# Patient Record
Sex: Female | Born: 1955 | Race: White | Hispanic: No | Marital: Married | Smoking: Former smoker
Health system: Southern US, Community
[De-identification: ages and names within clinical notes are randomized; demographics above are authoritative.]

## PROBLEM LIST (undated history)

## (undated) DIAGNOSIS — M8580 Other specified disorders of bone density and structure, unspecified site: Secondary | ICD-10-CM

## (undated) DIAGNOSIS — J449 Chronic obstructive pulmonary disease, unspecified: Secondary | ICD-10-CM

## (undated) DIAGNOSIS — K219 Gastro-esophageal reflux disease without esophagitis: Secondary | ICD-10-CM

## (undated) DIAGNOSIS — E079 Disorder of thyroid, unspecified: Secondary | ICD-10-CM

## (undated) DIAGNOSIS — G8929 Other chronic pain: Secondary | ICD-10-CM

## (undated) DIAGNOSIS — F419 Anxiety disorder, unspecified: Secondary | ICD-10-CM

## (undated) DIAGNOSIS — F32A Depression, unspecified: Secondary | ICD-10-CM

## (undated) DIAGNOSIS — F329 Major depressive disorder, single episode, unspecified: Secondary | ICD-10-CM

## (undated) DIAGNOSIS — E785 Hyperlipidemia, unspecified: Secondary | ICD-10-CM

## (undated) HISTORY — PX: WRIST SURGERY: SHX841

## (undated) HISTORY — DX: Anxiety disorder, unspecified: F41.9

## (undated) HISTORY — DX: Disorder of thyroid, unspecified: E07.9

## (undated) HISTORY — PX: VAGINAL HYSTERECTOMY: SUR661

## (undated) HISTORY — PX: LAMINECTOMY: SHX219

## (undated) HISTORY — PX: SPINAL FUSION: SHX223

## (undated) HISTORY — DX: Other chronic pain: G89.29

## (undated) HISTORY — DX: Major depressive disorder, single episode, unspecified: F32.9

## (undated) HISTORY — DX: Other specified disorders of bone density and structure, unspecified site: M85.80

## (undated) HISTORY — DX: Hyperlipidemia, unspecified: E78.5

## (undated) HISTORY — DX: Depression, unspecified: F32.A

## (undated) HISTORY — DX: Gastro-esophageal reflux disease without esophagitis: K21.9

---

## 2001-08-23 HISTORY — PX: DE QUERVAIN'S RELEASE: SHX1439

## 2005-05-17 ENCOUNTER — Emergency Department (HOSPITAL_COMMUNITY): Admission: EM | Admit: 2005-05-17 | Discharge: 2005-05-17 | Payer: Self-pay | Admitting: *Deleted

## 2005-05-24 ENCOUNTER — Other Ambulatory Visit: Admission: RE | Admit: 2005-05-24 | Discharge: 2005-05-24 | Payer: Self-pay | Admitting: Obstetrics and Gynecology

## 2005-05-31 ENCOUNTER — Encounter: Admission: RE | Admit: 2005-05-31 | Discharge: 2005-05-31 | Payer: Self-pay | Admitting: *Deleted

## 2006-04-18 ENCOUNTER — Other Ambulatory Visit: Admission: RE | Admit: 2006-04-18 | Discharge: 2006-04-18 | Payer: Self-pay | Admitting: Cardiology

## 2006-11-16 ENCOUNTER — Emergency Department (HOSPITAL_COMMUNITY): Admission: EM | Admit: 2006-11-16 | Discharge: 2006-11-16 | Payer: Self-pay | Admitting: Internal Medicine

## 2007-12-31 ENCOUNTER — Encounter
Admission: RE | Admit: 2007-12-31 | Discharge: 2007-12-31 | Payer: Self-pay | Admitting: Physical Medicine and Rehabilitation

## 2008-02-01 ENCOUNTER — Ambulatory Visit (HOSPITAL_COMMUNITY): Admission: RE | Admit: 2008-02-01 | Discharge: 2008-02-01 | Payer: Self-pay | Admitting: Gastroenterology

## 2008-06-04 ENCOUNTER — Encounter: Admission: RE | Admit: 2008-06-04 | Discharge: 2008-06-04 | Payer: Self-pay | Admitting: Obstetrics and Gynecology

## 2008-08-07 ENCOUNTER — Encounter: Admission: RE | Admit: 2008-08-07 | Discharge: 2008-08-07 | Payer: Self-pay | Admitting: Neurosurgery

## 2008-09-17 ENCOUNTER — Inpatient Hospital Stay (HOSPITAL_COMMUNITY): Admission: RE | Admit: 2008-09-17 | Discharge: 2008-09-20 | Payer: Self-pay | Admitting: Neurosurgery

## 2008-12-19 ENCOUNTER — Ambulatory Visit (HOSPITAL_COMMUNITY): Admission: RE | Admit: 2008-12-19 | Discharge: 2008-12-19 | Payer: Self-pay | Admitting: Neurosurgery

## 2009-01-07 ENCOUNTER — Inpatient Hospital Stay (HOSPITAL_COMMUNITY): Admission: AD | Admit: 2009-01-07 | Discharge: 2009-01-08 | Payer: Self-pay | Admitting: Neurosurgery

## 2009-06-07 ENCOUNTER — Emergency Department (HOSPITAL_COMMUNITY): Admission: EM | Admit: 2009-06-07 | Discharge: 2009-06-07 | Payer: Self-pay | Admitting: Emergency Medicine

## 2009-06-24 ENCOUNTER — Encounter: Admission: RE | Admit: 2009-06-24 | Discharge: 2009-06-24 | Payer: Self-pay | Admitting: Obstetrics and Gynecology

## 2009-07-14 ENCOUNTER — Encounter: Admission: RE | Admit: 2009-07-14 | Discharge: 2009-07-14 | Payer: Self-pay | Admitting: Obstetrics and Gynecology

## 2009-10-31 ENCOUNTER — Emergency Department (HOSPITAL_COMMUNITY): Admission: EM | Admit: 2009-10-31 | Discharge: 2009-10-31 | Payer: Self-pay | Admitting: Emergency Medicine

## 2010-09-01 IMAGING — CT CT L SPINE W/O CM
4 of 9 series · 13 of 33 positions shown, 15 images · non-contrast
Comparison: MRI 12/31/2007.

CLINICAL DATA: Severe compression of the left L4 nerve root and
possibly L5.  Preop for PLIF.

CT LUMBAR SPINE WITHOUT CONTRAST
TECHNIQUE: Multidetector CT imaging of the lumbar spine was
performed without intravenous contrast administration. Multiplanar
CT image reconstructions were also generated.

[Series 2: l spine · axial · 0.27mm/px · z∈[-186,-106]mm · 2 of 96 slices shown]
[im 32/96  bone]
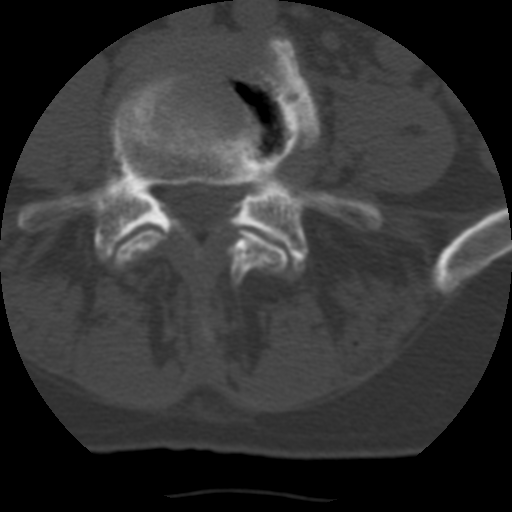
[im 64/96  bone]
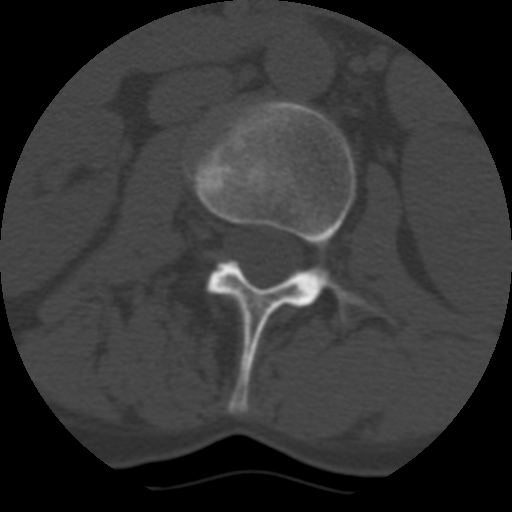

[Series 3: bone windows · axial · 0.27mm/px · z∈[-206,-86]mm · 3 of 96 slices shown, 4 images]
[im 24/96  soft-tissue]
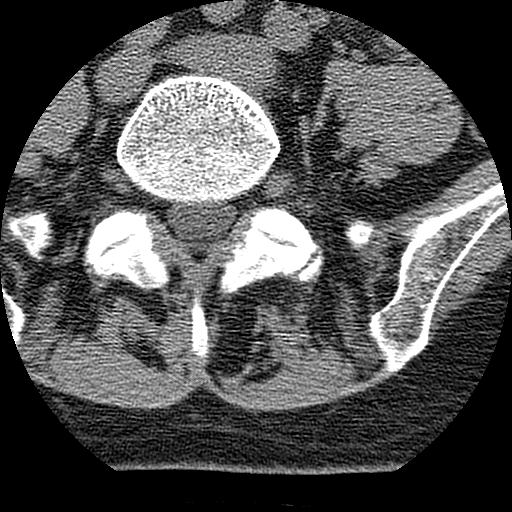
[im 24/96  bone]
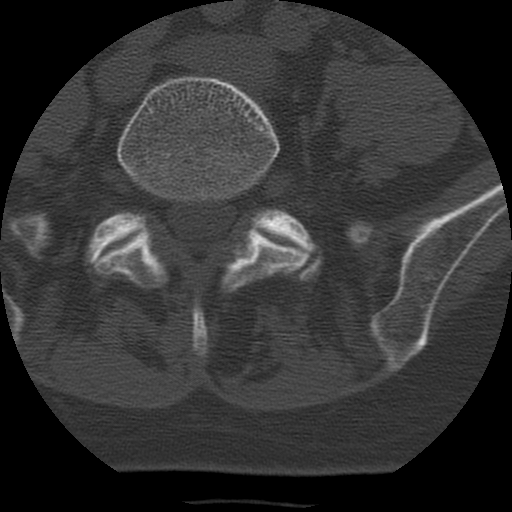
[im 48/96  bone]
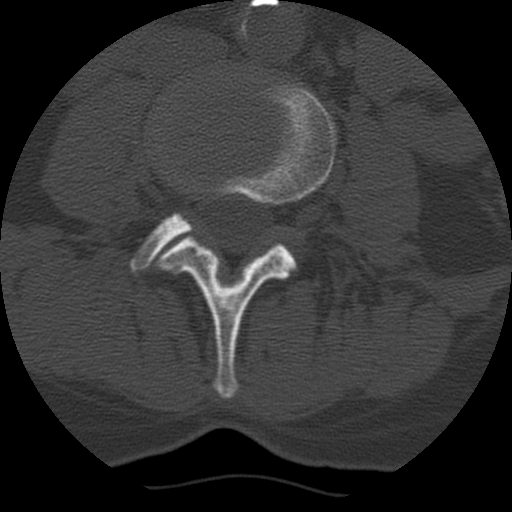
[im 72/96  bone]
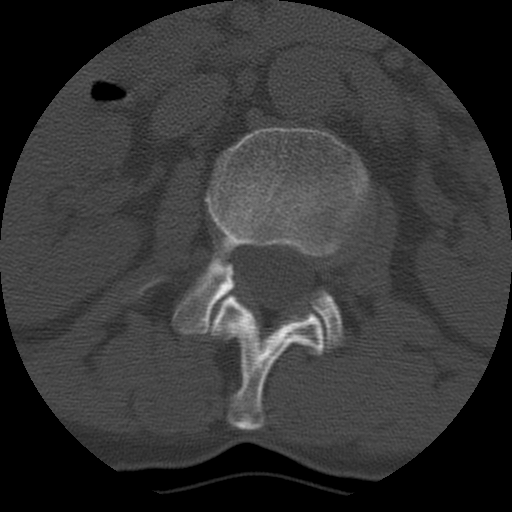

[Series 400: cor · coronal · 0.47mm/px · 3 of 40 slices shown]
[im 8/40  bone]
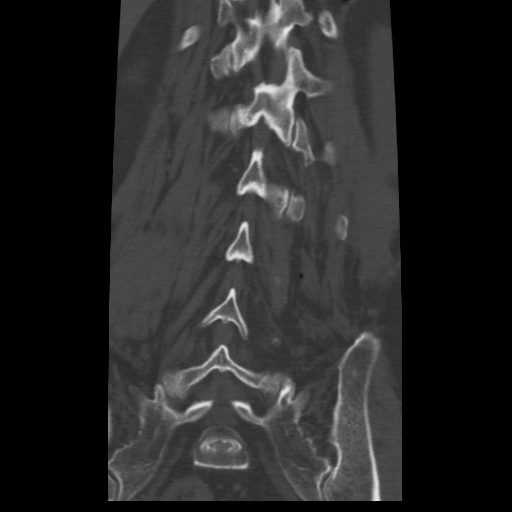
[im 16/40  bone]
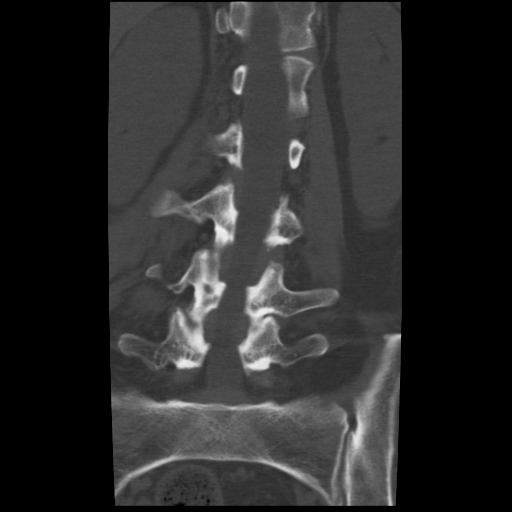
[im 24/40  bone]
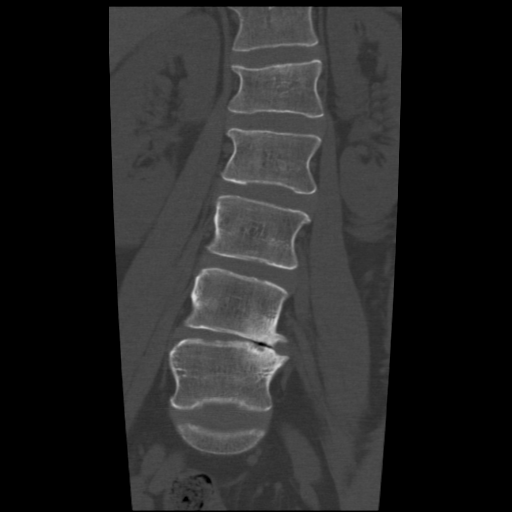

[Series 401: sag · sagittal · 0.47mm/px · 5 of 40 slices shown, 6 images]
[im 14/40  bone]
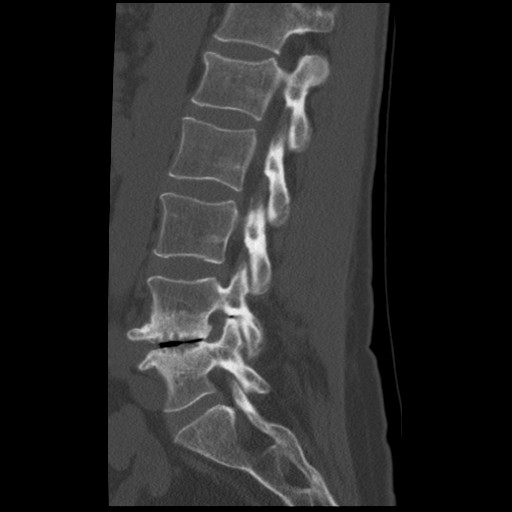
[im 17/40  bone]
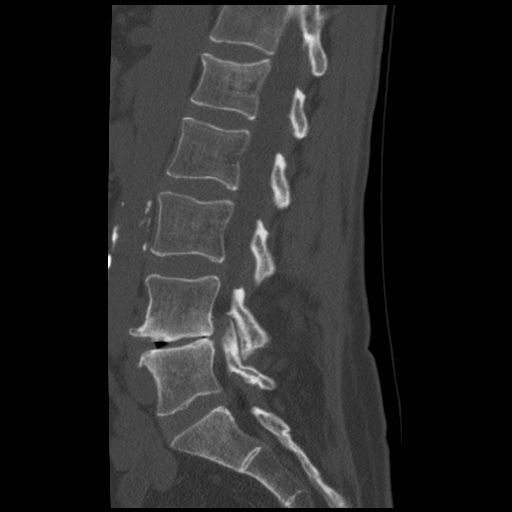
[im 20/40  soft-tissue]
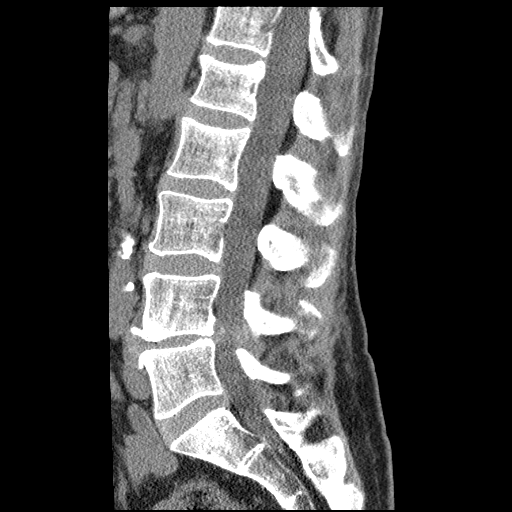
[im 20/40  bone]
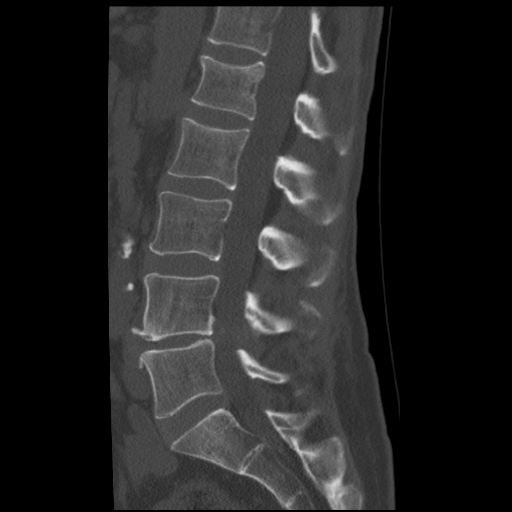
[im 23/40  bone]
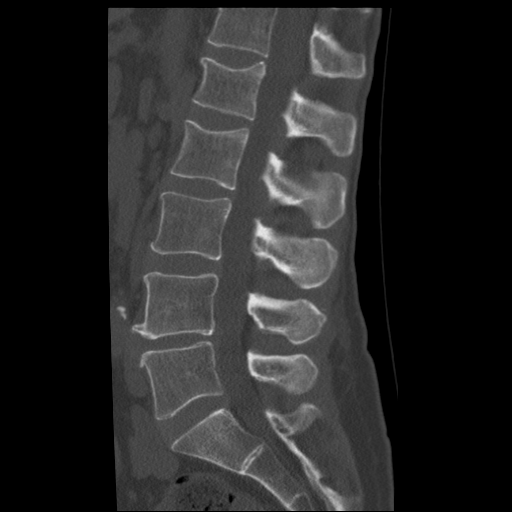
[im 27/40  bone]
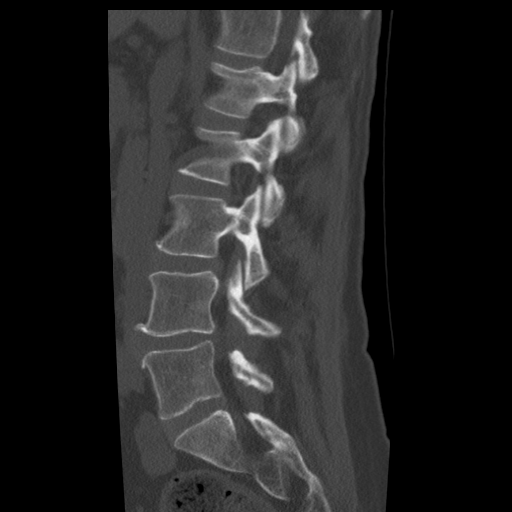

[13 of 33 positions shown; findings below may reference images not displayed]

FINDINGS: There is S-shaped curvature of the lumbar spine convexed
to the left at L1-2 into the right at L4-5.  There is left sided
endplate sclerosis enlarged left lateral osteophytes at L4-5.
Endplate erosions are noted.  Chronic superior endplate to
depression at L1 is stable.  The soft tissues are unremarkable.
Individual disc levels are as follows.  The disc levels at L1-2
rule and above are normal.

L2-3:  There is mild facet degenerative change.  There is no focal
stenosis.

L3-4:  Mild facet degenerative changes evident.  There is some disc
bulging.  There is no significant stenosis.

L4-5:  Severe left foraminal stenosis is secondary to asymmetric
disc bulge and marked to facet hypertrophy.  There is endplate
osteophyte formation as well.  The mild right foraminal narrowing
is stable.

L5-S1:  There is mild asymmetric facet hypertrophy, left greater
than right.  No focal stenosis is evident.
IMPRESSION: 1.  Severe left to lateral recess and foraminal narrowing at L4-5
secondary to asymmetric disc bulging and facet disease.
2.  Focal dextroconvex curvature at L4-5.
3.  Mild right foraminal narrowing at L4-5.
4.  Mild facet degenerative changes at L2-3, L3-4, and L5 S1
without to additional focal stenoses.

## 2010-09-02 ENCOUNTER — Ambulatory Visit
Admission: RE | Admit: 2010-09-02 | Discharge: 2010-09-02 | Payer: Self-pay | Source: Home / Self Care | Attending: Obstetrics & Gynecology | Admitting: Obstetrics & Gynecology

## 2010-09-22 ENCOUNTER — Ambulatory Visit (HOSPITAL_COMMUNITY)
Admission: RE | Admit: 2010-09-22 | Discharge: 2010-09-22 | Payer: Self-pay | Source: Home / Self Care | Attending: Obstetrics and Gynecology | Admitting: Obstetrics and Gynecology

## 2010-09-23 ENCOUNTER — Ambulatory Visit: Admit: 2010-09-23 | Payer: Self-pay | Admitting: Obstetrics and Gynecology

## 2010-09-23 ENCOUNTER — Ambulatory Visit: Payer: PRIVATE HEALTH INSURANCE | Admitting: Obstetrics and Gynecology

## 2010-09-23 DIAGNOSIS — M899 Disorder of bone, unspecified: Secondary | ICD-10-CM

## 2010-09-23 DIAGNOSIS — M949 Disorder of cartilage, unspecified: Secondary | ICD-10-CM

## 2010-10-28 ENCOUNTER — Ambulatory Visit: Payer: PRIVATE HEALTH INSURANCE | Admitting: Obstetrics and Gynecology

## 2010-11-13 NOTE — Progress Notes (Unsigned)
NAMETAYRA, DAWE NO.:  [id number]  MEDICAL RECORD NO.:  [id number]           PATIENT TYPE:  A  LOCATION:  [facility]                   FACILITY:  [facility]  PHYSICIAN:  Argentina Donovan, MD        DATE OF BIRTH:  1956-08-19  DATE OF SERVICE:  09/23/2010                                 CLINIC NOTE  The patient is a 54 year old Caucasian female who had total abdominal hysterectomy, bilateral salpingo-oophorectomy in her 30s, was placed on estrogen and subsequently her mother developed breast cancer.  She got frightened and stopped the estrogen that she had been on it for 10 years.  She takes calcium with vitamin D plus Fosamax 2 years ago. Three years ago, she had a bone density which showed osteopenia.  We did repeat that and there had been no change over that time.  She also had a normal mammogram done and is doing well with her current therapy, so we will keep her on that.  IMPRESSION:  The patient has osteopenia, on treatment with good success.          ______________________________ Argentina Donovan, MD    PR/MEDQ  D:  09/23/2010  T:  09/24/2010  Job:  [id number]

## 2010-11-16 LAB — CULTURE, BLOOD (ROUTINE X 2)
Culture: NO GROWTH
Culture: NO GROWTH

## 2010-11-16 LAB — CBC
HCT: 38.2 % (ref 36.0–46.0)
Hemoglobin: 13.1 g/dL (ref 12.0–15.0)
MCHC: 34.3 g/dL (ref 30.0–36.0)
MCV: 97.2 fL (ref 78.0–100.0)
Platelets: 128 10*3/uL — ABNORMAL LOW (ref 150–400)
RBC: 3.93 MIL/uL (ref 3.87–5.11)
RDW: 14.9 % (ref 11.5–15.5)
WBC: 4.2 10*3/uL (ref 4.0–10.5)

## 2010-11-16 LAB — DIFFERENTIAL
Basophils Absolute: 0 10*3/uL (ref 0.0–0.1)
Basophils Relative: 0 % (ref 0–1)
Eosinophils Absolute: 0 10*3/uL (ref 0.0–0.7)
Eosinophils Relative: 1 % (ref 0–5)
Lymphocytes Relative: 54 % — ABNORMAL HIGH (ref 12–46)
Lymphs Abs: 2.2 10*3/uL (ref 0.7–4.0)
Monocytes Absolute: 0.3 10*3/uL (ref 0.1–1.0)
Monocytes Relative: 6 % (ref 3–12)
Neutro Abs: 1.6 10*3/uL — ABNORMAL LOW (ref 1.7–7.7)
Neutrophils Relative %: 39 % — ABNORMAL LOW (ref 43–77)

## 2010-11-16 LAB — URINALYSIS, ROUTINE W REFLEX MICROSCOPIC
Bilirubin Urine: NEGATIVE
Glucose, UA: NEGATIVE mg/dL
Hgb urine dipstick: NEGATIVE
Ketones, ur: NEGATIVE mg/dL
Nitrite: NEGATIVE
Protein, ur: NEGATIVE mg/dL
Specific Gravity, Urine: 1.015 (ref 1.005–1.030)
Urobilinogen, UA: 0.2 mg/dL (ref 0.0–1.0)
pH: 5.5 (ref 5.0–8.0)

## 2010-11-16 LAB — BASIC METABOLIC PANEL
BUN: 10 mg/dL (ref 6–23)
CO2: 30 mEq/L (ref 19–32)
Calcium: 7.9 mg/dL — ABNORMAL LOW (ref 8.4–10.5)
Chloride: 107 mEq/L (ref 96–112)
Creatinine, Ser: 0.51 mg/dL (ref 0.4–1.2)
GFR calc Af Amer: >60 mL/min (ref >60)
GFR calc non Af Amer: >60 mL/min (ref >60)
Glucose, Bld: 84 mg/dL (ref 70–99)
Potassium: 4.1 mEq/L (ref 3.5–5.1)
Sodium: 140 mEq/L (ref 135–145)

## 2010-11-16 LAB — LACTIC ACID, PLASMA: Lactic Acid, Venous: 0.4 mmol/L — ABNORMAL LOW (ref 0.5–2.2)

## 2010-11-26 LAB — URINALYSIS, ROUTINE W REFLEX MICROSCOPIC
Glucose, UA: NEGATIVE mg/dL
Protein, ur: NEGATIVE mg/dL
Specific Gravity, Urine: 1.025 (ref 1.005–1.030)

## 2010-11-26 LAB — COMPREHENSIVE METABOLIC PANEL
ALT: 9 U/L (ref 0–35)
BUN: 8 mg/dL (ref 6–23)
CO2: 31 mEq/L (ref 19–32)
Calcium: 8 mg/dL — ABNORMAL LOW (ref 8.4–10.5)
Creatinine, Ser: 0.49 mg/dL (ref 0.4–1.2)
GFR calc non Af Amer: >60 mL/min (ref >60)
Glucose, Bld: 80 mg/dL (ref 70–99)
Total Protein: 5.3 g/dL — ABNORMAL LOW (ref 6.0–8.3)

## 2010-11-26 LAB — RAPID URINE DRUG SCREEN, HOSP PERFORMED
Barbiturates: NOT DETECTED
Benzodiazepines: NOT DETECTED
Opiates: POSITIVE — AB

## 2010-11-26 LAB — DIFFERENTIAL
Eosinophils Absolute: 0 10*3/uL (ref 0.0–0.7)
Lymphocytes Relative: 52 % — ABNORMAL HIGH (ref 12–46)
Lymphs Abs: 1.9 10*3/uL (ref 0.7–4.0)
Monocytes Relative: 6 % (ref 3–12)
Neutro Abs: 1.5 10*3/uL — ABNORMAL LOW (ref 1.7–7.7)
Neutrophils Relative %: 41 % — ABNORMAL LOW (ref 43–77)

## 2010-11-26 LAB — CBC
Hemoglobin: 11.5 g/dL — ABNORMAL LOW (ref 12.0–15.0)
MCHC: 33.3 g/dL (ref 30.0–36.0)
MCV: 97.4 fL (ref 78.0–100.0)
RBC: 3.54 MIL/uL — ABNORMAL LOW (ref 3.87–5.11)
RDW: 14.6 % (ref 11.5–15.5)

## 2010-12-01 LAB — COMPREHENSIVE METABOLIC PANEL
CO2: 31 mEq/L (ref 19–32)
Calcium: 9.3 mg/dL (ref 8.4–10.5)
Creatinine, Ser: 0.55 mg/dL (ref 0.4–1.2)
GFR calc Af Amer: >60 mL/min (ref >60)
GFR calc non Af Amer: >60 mL/min (ref >60)
Glucose, Bld: 101 mg/dL — ABNORMAL HIGH (ref 70–99)

## 2010-12-01 LAB — DIFFERENTIAL
Lymphocytes Relative: 32 % (ref 12–46)
Lymphs Abs: 2.2 10*3/uL (ref 0.7–4.0)
Neutrophils Relative %: 63 % (ref 43–77)

## 2010-12-01 LAB — CBC
Hemoglobin: 14.9 g/dL (ref 12.0–15.0)
MCHC: 34.6 g/dL (ref 30.0–36.0)
MCV: 97.4 fL (ref 78.0–100.0)
RBC: 4.42 MIL/uL (ref 3.87–5.11)
RDW: 12 % (ref 11.5–15.5)

## 2010-12-01 LAB — APTT: aPTT: 27 seconds (ref 24–37)

## 2010-12-01 LAB — URINALYSIS, ROUTINE W REFLEX MICROSCOPIC
Protein, ur: NEGATIVE mg/dL
Urobilinogen, UA: 0.2 mg/dL (ref 0.0–1.0)

## 2010-12-01 LAB — PROTIME-INR
INR: 1 (ref 0.00–1.49)
Prothrombin Time: 13.4 seconds (ref 11.6–15.2)

## 2010-12-02 LAB — CBC
MCV: 99.8 fL (ref 78.0–100.0)
Platelets: 159 10*3/uL (ref 150–400)
WBC: 4.4 10*3/uL (ref 4.0–10.5)

## 2010-12-02 LAB — URINE MICROSCOPIC-ADD ON

## 2010-12-02 LAB — URINALYSIS, ROUTINE W REFLEX MICROSCOPIC
Nitrite: NEGATIVE
Specific Gravity, Urine: 1.021 (ref 1.005–1.030)
pH: 7 (ref 5.0–8.0)

## 2010-12-02 LAB — DIFFERENTIAL
Basophils Relative: 0 % (ref 0–1)
Lymphocytes Relative: 33 % (ref 12–46)
Monocytes Absolute: 0.2 10*3/uL (ref 0.1–1.0)
Monocytes Relative: 5 % (ref 3–12)
Neutro Abs: 2.7 10*3/uL (ref 1.7–7.7)

## 2010-12-02 LAB — APTT: aPTT: 28 seconds (ref 24–37)

## 2010-12-02 LAB — COMPREHENSIVE METABOLIC PANEL
AST: 34 U/L (ref 0–37)
Albumin: 3.7 g/dL (ref 3.5–5.2)
Chloride: 101 mEq/L (ref 96–112)
Creatinine, Ser: 0.62 mg/dL (ref 0.4–1.2)
GFR calc Af Amer: >60 mL/min (ref >60)
Total Bilirubin: 0.5 mg/dL (ref 0.3–1.2)

## 2010-12-07 LAB — URINALYSIS, ROUTINE W REFLEX MICROSCOPIC
Bilirubin Urine: NEGATIVE
Nitrite: NEGATIVE
Specific Gravity, Urine: 1.023 (ref 1.005–1.030)
Urobilinogen, UA: 0.2 mg/dL (ref 0.0–1.0)
pH: 7 (ref 5.0–8.0)

## 2010-12-07 LAB — COMPREHENSIVE METABOLIC PANEL
ALT: 21 U/L (ref 0–35)
Albumin: 3.6 g/dL (ref 3.5–5.2)
Alkaline Phosphatase: 29 U/L — ABNORMAL LOW (ref 39–117)
Calcium: 9.2 mg/dL (ref 8.4–10.5)
Potassium: 4.1 mEq/L (ref 3.5–5.1)
Sodium: 137 mEq/L (ref 135–145)
Total Protein: 5.8 g/dL — ABNORMAL LOW (ref 6.0–8.3)

## 2010-12-07 LAB — APTT: aPTT: 24 seconds (ref 24–37)

## 2010-12-07 LAB — DIFFERENTIAL
Basophils Relative: 0 % (ref 0–1)
Lymphs Abs: 2.1 10*3/uL (ref 0.7–4.0)
Monocytes Absolute: 0.3 10*3/uL (ref 0.1–1.0)
Monocytes Relative: 5 % (ref 3–12)
Neutro Abs: 2.6 10*3/uL (ref 1.7–7.7)

## 2010-12-07 LAB — TYPE AND SCREEN: Antibody Screen: NEGATIVE

## 2010-12-07 LAB — CBC
Platelets: 153 10*3/uL (ref 150–400)
RDW: 13.4 % (ref 11.5–15.5)

## 2010-12-07 LAB — ABO/RH: ABO/RH(D): B POS

## 2011-01-05 NOTE — Op Note (Signed)
NAMEROBYNNE, ROAT                 ACCOUNT NO.:  [id number]   MEDICAL RECORD NO.:  [id number]          PATIENT TYPE:  AMB   LOCATION:  ENDO                         FACILITY:  [facility]   PHYSICIAN:  Shirley Friar, MDDATE OF BIRTH:  12-03-1955   DATE OF PROCEDURE:  02/01/2008  DATE OF DISCHARGE:                               OPERATIVE REPORT   INDICATIONS:  Surveillance for colon polyps.   MEDICINES:  Propofol 450 mg IV, fentanyl 100 mcg IV, Versed 4 mg IV  (sedation given per anesthesia).   FINDINGS:  Rectal exam was normal.  A pediatric Pentax colonoscope was  inserted into a fair prepped colon and advanced to the cecum where  ileocecal valve and appendiceal orifice were identified.  On careful  withdrawal of the colonoscope, no polyps were identified.  There were a  few small diverticula seen in the sigmoid colon but otherwise  colonoscopy was normal.  Retroflexion was unremarkable.   ASSESSMENT:  Sigmoid diverticulosis, otherwise normal colonoscopy.   PLAN:  Repeat colonoscopy in five years.      Shirley Friar, MD  Electronically Signed     VCS/MEDQ  D:  02/01/2008  T:  02/01/2008  Job:  [id number]   cc:   Nolon Nations, MD
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
## 2011-01-05 NOTE — Discharge Summary (Signed)
Jessica Holmes, Jessica Holmes                 ACCOUNT NO.:  [id number]   MEDICAL RECORD NO.:  [id number]          PATIENT TYPE:  INP   LOCATION:  3014                         FACILITY:  [facility]   PHYSICIAN:  Payton Doughty, M.D.      DATE OF BIRTH:  04/23/1956   DATE OF ADMISSION:  09/17/2008  DATE OF DISCHARGE:  09/20/2008                               DISCHARGE SUMMARY   ADMITTING DIAGNOSIS:  Spondylosis, L4-5.   DISCHARGE DIAGNOSIS:  Spondylosis, L4-5.   PROCEDURES:  L4-5 anterolateral interbody fusion using AxiaLIF  technique.   SURGEON:  Payton Doughty, M.D.   COMPLICATIONS:  None.   DISCHARGE STATUS:  Alive and well.   BODY OF TEXT:  A 55 year old right-handed white girl whose history and  physical is recounted in chart.  She has spondylosis of L4-5.  She has  been on pain management.  She has had positive scan and is admitted for  fusion.  General exam is intact.  She had a history of hepatitis C.  Neurologic exam is intact for left L3 and L4 radiculopathy.  She was  admitted after ascertaining normal laboratory values, underwent an  AxiaLIF at L4-5.  Postoperatively, she has done pretty well.  Because of  all her meds, she was kept in the ICU for couple of days to make sure  respirations are okay.  She spent one night on the floor.  She is up and  about eating and voiding normally.  Strength is full.  Incision is dry  and well healing.  She is being discharged home to the care of her  family on her Kadian and morphine sulfate.  The plan will be to taper  that as she recovers from her operation.           ______________________________  Payton Doughty, M.D.     MWR/MEDQ  D:  09/20/2008  T:  09/20/2008  Job:  [id number]

## 2011-01-05 NOTE — Op Note (Signed)
NAMEPIHU, BASIL                 ACCOUNT NO.:  [id number]   MEDICAL RECORD NO.:  [id number]          PATIENT TYPE:  INP   LOCATION:  3315                         FACILITY:  [facility]   PHYSICIAN:  Payton Doughty, M.D.      DATE OF BIRTH:  09-03-1955   DATE OF PROCEDURE:  DATE OF DISCHARGE:                               OPERATIVE REPORT   PREOPERATIVE DIAGNOSIS:  Left L4-5 radiculopathy secondary to  spondylosis.   POSTOPERATIVE DIAGNOSIS:  Left L4-5 radiculopathy secondary to  spondylosis.   OPERATIVE PROCEDURE:  Left L4-5 laminotomy, foraminotomy, discectomy.   ANESTHESIA:  General endotracheal.   PREPARATION:  Prepped and draped with alcohol wipe.   COMPLICATIONS:  None.   __________   BODY TEXT:  A 55 year old girl who had an XLIF about 4 months ago, now  has some residual left leg pain, taken to the operating room, smoothly  anesthetized and intubated, placed prone on the operating table.  Following shave, prep, and drape in the usual sterile fashion, the skin  was infiltrated with 1% lidocaine and 1:400,000 epinephrine.  The skin  was incised over the L4 lamina.  This was dissected free in the  subperiosteal plane.  Intraoperative x-ray confirmed correctness of  level.  Having confirmed correctness of level, hemi semi-laminectomy was  carried out of L4 to the top of ligamentum flavum that was removed in a  retrograde fashion dissolving and decompression of the lateral recess.  The 5 root was followed under the top of 5 lamina and generously  decompressed.  The neuroforamen at L4-5 was explored and found to be  open.  The posterior remnant of the disk was removed from dorsal to the  interbody device for the XLIF.  Both the 4 and 5 roots were completely  decompressed.  The wound was irrigated.  Hemostasis assured.  Depo-  Medrol soaked fat was placed in the laminotomy defect.  Successive  layers of 0 Vicryl, 2-0 Vicryl, and 3-0 nylon were used to close.  Betadine and Telfa  dressing was applied, made occlusive with OpSite, and  the patient returned to recovery room in good condition.           ______________________________  Payton Doughty, M.D.     MWR/MEDQ  D:  01/07/2009  T:  01/08/2009  Job:  [id number]
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
## 2011-01-05 NOTE — Op Note (Signed)
Jessica Holmes, Jessica Holmes                 ACCOUNT NO.:  [id number]   MEDICAL RECORD NO.:  [id number]          PATIENT TYPE:  AMB   LOCATION:  ENDO                         FACILITY:  [facility]   PHYSICIAN:  Shirley Friar, MDDATE OF BIRTH:  1956-02-29   DATE OF PROCEDURE:  02/01/2008  DATE OF DISCHARGE:                               OPERATIVE REPORT   PROCEDURE:  Upper GI.   INDICATIONS:  Reflux.   MEDICINES:  See preceding colonoscopy.   FINDINGS:  Endoscope was inserted through oropharynx, esophagus was  intubated and was normal in its entirety.  Endoscope was advanced down  into the stomach which revealed some linear erythematous streaks in the  distal stomach consistent with mild gastritis.  No ulcers were  identified.  Retroflexion was done which revealed normal proximal  stomach.  Endoscope was straightened and advanced into the duodenal bulb  and second portion of the duodenum which were both normal.  Endoscope  was withdrawn and confirmed the above findings.   ASSESSMENT:  Mild antral gastritis, likely due to medicines, otherwise  normal upper endoscopy.   PLAN:  Daily proton pump inhibitor and avoid NSAIDs.      Shirley Friar, MD  Electronically Signed     VCS/MEDQ  D:  02/01/2008  T:  02/01/2008  Job:  [id number]   cc:   Nolon Nations, MD

## 2011-01-05 NOTE — Op Note (Signed)
[name], [name]                 ACCOUNT NO.:  [id number]   MEDICAL RECORD NO.:  [id number]          PATIENT TYPE:  INP   LOCATION:  2899                         FACILITY:  [facility]   PHYSICIAN:  Payton Doughty, M.D.      DATE OF BIRTH:  15-Dec-1955   DATE OF PROCEDURE:  09/17/2008  DATE OF DISCHARGE:                               OPERATIVE REPORT   PREOPERATIVE DIAGNOSIS:  Spondylosis and lateral stenosis at L4-L5.   POSTOPERATIVE DIAGNOSIS:  Spondylosis and lateral stenosis at L4-L5.   PROCEDURE:  L4-L5 extreme lateral interbody fusion with lateral plate.   SURGEON:  Payton Doughty, MD   ANESTHESIA:  General endotracheal.   PREPARATION:  Prepped and draped with alcohol wipe.   COMPLICATIONS:  None.   NURSE ASSISTANT:  Bedelia Person, MD   DOCTOR ASSISTANT:  Cristi Loron, MD   BODY OF TEXT:  A 55 year old lady with severe spondylosis and lateral  left-sided osteophyte at L4-L5 and left leg pain, taken to operating  room, smoothly anesthetized and intubated.  Placed left side up, right  side down lateral position on the operating table.  Flexed and taped.  The table was flexed to allow access past the iliac crest into the L4-L5  disk space.  Monitoring electrodes were placed.  Following shave, prep,  and drape in the usual sterile fashion, 2 skin incisions were made right  at the iliac crest, one directly laterally and one slightly posterior  through the posterior incision.  The fascia was penetrated and the  retroperitoneal space accessed.  The lateral incision was then used to  connect these with assurance that no major organs were violated.  The  stimulating trocar was advanced down through the psoas muscle and under  fluoro, the Steinmann pin was placed in the disk space.  Initial  attempts at dilation were met with monitoring warnings that were close  to the pelvic plexus.  The Steinmann pin was then positioned more  anteriorly and the disk space accessed and the correct  docking  identified, this time without the nerve monitoring alarms.  Sequential  dilation at psoas was carried out under electrical monitoring and then  the dilator was placed.  A shim placed into the disk space.  The psoas  muscle dilated out of the way and the disk space accessed.  Lateral  osteophyte was taken down.  The disk space was then entered and  diskectomy carried out.  Working across the disk space, a lateral  release was obtained.  The disk space trials were placed and it was  found that the 8 x 18 x 50 mm footprint worked best.  This was packed  with bone-inducing compound and tapped into place.  A lateral plate was  then placed using parallel screws along the disk space under  fluoroscopic guidance and  bicortical purchase was obtained.  The plates were attached.  The rods  were placed and locked down.  Final fluoroscopic films showed good  placement of all devices.  The incisions were closed in successive  layers 0 Vicryl, 2-0 Vicryl,  and 3-0 nylon and the patient returned to  the recovery room in good condition.           ______________________________  Payton Doughty, M.D.     MWR/MEDQ  D:  09/17/2008  T:  09/17/2008  Job:  [id number]

## 2011-05-11 ENCOUNTER — Ambulatory Visit (INDEPENDENT_AMBULATORY_CARE_PROVIDER_SITE_OTHER): Payer: PRIVATE HEALTH INSURANCE | Admitting: Orthopedic Surgery

## 2011-05-11 ENCOUNTER — Encounter: Payer: Self-pay | Admitting: Orthopedic Surgery

## 2011-05-11 DIAGNOSIS — M1710 Unilateral primary osteoarthritis, unspecified knee: Secondary | ICD-10-CM

## 2011-05-11 DIAGNOSIS — M222X9 Patellofemoral disorders, unspecified knee: Secondary | ICD-10-CM

## 2011-05-11 DIAGNOSIS — M25569 Pain in unspecified knee: Secondary | ICD-10-CM

## 2011-05-12 ENCOUNTER — Encounter: Payer: Self-pay | Admitting: Orthopedic Surgery

## 2011-05-12 DIAGNOSIS — M222X9 Patellofemoral disorders, unspecified knee: Secondary | ICD-10-CM | POA: Insufficient documentation

## 2011-05-12 DIAGNOSIS — M1710 Unilateral primary osteoarthritis, unspecified knee: Secondary | ICD-10-CM | POA: Insufficient documentation

## 2011-05-12 DIAGNOSIS — M179 Osteoarthritis of knee, unspecified: Secondary | ICD-10-CM | POA: Insufficient documentation

## 2011-05-12 NOTE — Progress Notes (Signed)
Chief complaint: Pain right knee HPI:(4) I have a 41 low female who was referred to me by Dr. Loraine Leriche away with a complaint of anterior knee pain. She describes her pain as nonradiating located on the anterior medial aspect of the right knee associated with stairclimbing swelling. She denies catching locking or giving way. She's had this pain now for several months.  ROS:(2) normal at present  PFSH: (1)  Past Medical History  Diagnosis Date  . Anxiety   . Chronic pain   . Thyroid disorder   . Osteopenia   . Acid reflux      Physical Exam(12) GENERAL: normal development   CDV: pulses are normal   Skin: normal  Lymph: nodes were not palpable/normal  Psychiatric: awake, alert and oriented  Neuro: normal sensation  MSK ambulation is normal.  The right knee has crepitance and tenderness around the peripatellar joint. Range of motion is normal. Strength is normal. Knee joint stability is normal.  Left knee is also noted to have crepitance on range of motion no tenderness no swelling normal strength, range of motion, stability.  Imaging: An office imaging shows that she is patellofemoral arthritis with spurs the tibiofemoral articulation is normal and the alignment is normal  Assessment: Primarily patellofemoral arthritis    Plan: Anti-inflammatories and quadriceps strengthening program. No surgery needed at this time.

## 2011-09-06 ENCOUNTER — Other Ambulatory Visit (HOSPITAL_COMMUNITY): Payer: Self-pay | Admitting: Family Medicine

## 2011-09-06 DIAGNOSIS — Z1231 Encounter for screening mammogram for malignant neoplasm of breast: Secondary | ICD-10-CM

## 2011-10-05 ENCOUNTER — Ambulatory Visit (HOSPITAL_COMMUNITY)
Admission: RE | Admit: 2011-10-05 | Discharge: 2011-10-05 | Disposition: A | Discharge status: Home / Self Care | Payer: PRIVATE HEALTH INSURANCE | Source: Ambulatory Visit | Attending: Family Medicine | Admitting: Family Medicine

## 2011-10-05 DIAGNOSIS — Z1231 Encounter for screening mammogram for malignant neoplasm of breast: Secondary | ICD-10-CM

## 2012-06-11 ENCOUNTER — Observation Stay (HOSPITAL_COMMUNITY)
Admission: EM | Admit: 2012-06-11 | Discharge: 2012-06-12 | Disposition: A | Discharge status: Home / Self Care | Payer: PRIVATE HEALTH INSURANCE | Attending: Internal Medicine | Admitting: Internal Medicine

## 2012-06-11 ENCOUNTER — Emergency Department (HOSPITAL_COMMUNITY): Payer: PRIVATE HEALTH INSURANCE

## 2012-06-11 ENCOUNTER — Encounter (HOSPITAL_COMMUNITY): Payer: Self-pay | Admitting: Emergency Medicine

## 2012-06-11 DIAGNOSIS — R509 Fever, unspecified: Secondary | ICD-10-CM | POA: Diagnosis present

## 2012-06-11 DIAGNOSIS — K219 Gastro-esophageal reflux disease without esophagitis: Secondary | ICD-10-CM | POA: Diagnosis present

## 2012-06-11 DIAGNOSIS — N289 Disorder of kidney and ureter, unspecified: Secondary | ICD-10-CM | POA: Insufficient documentation

## 2012-06-11 DIAGNOSIS — G894 Chronic pain syndrome: Secondary | ICD-10-CM | POA: Insufficient documentation

## 2012-06-11 DIAGNOSIS — N179 Acute kidney failure, unspecified: Secondary | ICD-10-CM

## 2012-06-11 DIAGNOSIS — G929 Unspecified toxic encephalopathy: Principal | ICD-10-CM | POA: Insufficient documentation

## 2012-06-11 DIAGNOSIS — J4489 Other specified chronic obstructive pulmonary disease: Secondary | ICD-10-CM | POA: Insufficient documentation

## 2012-06-11 DIAGNOSIS — G8929 Other chronic pain: Secondary | ICD-10-CM | POA: Diagnosis present

## 2012-06-11 DIAGNOSIS — E039 Hypothyroidism, unspecified: Secondary | ICD-10-CM | POA: Insufficient documentation

## 2012-06-11 DIAGNOSIS — E079 Disorder of thyroid, unspecified: Secondary | ICD-10-CM

## 2012-06-11 DIAGNOSIS — Y92009 Unspecified place in unspecified non-institutional (private) residence as the place of occurrence of the external cause: Secondary | ICD-10-CM | POA: Insufficient documentation

## 2012-06-11 DIAGNOSIS — M8580 Other specified disorders of bone density and structure, unspecified site: Secondary | ICD-10-CM | POA: Insufficient documentation

## 2012-06-11 DIAGNOSIS — Z79899 Other long term (current) drug therapy: Secondary | ICD-10-CM | POA: Insufficient documentation

## 2012-06-11 DIAGNOSIS — E860 Dehydration: Secondary | ICD-10-CM | POA: Insufficient documentation

## 2012-06-11 DIAGNOSIS — G92 Toxic encephalopathy: Secondary | ICD-10-CM | POA: Insufficient documentation

## 2012-06-11 DIAGNOSIS — F411 Generalized anxiety disorder: Secondary | ICD-10-CM | POA: Insufficient documentation

## 2012-06-11 DIAGNOSIS — T4275XA Adverse effect of unspecified antiepileptic and sedative-hypnotic drugs, initial encounter: Secondary | ICD-10-CM | POA: Insufficient documentation

## 2012-06-11 DIAGNOSIS — F419 Anxiety disorder, unspecified: Secondary | ICD-10-CM | POA: Diagnosis present

## 2012-06-11 DIAGNOSIS — M1710 Unilateral primary osteoarthritis, unspecified knee: Secondary | ICD-10-CM | POA: Diagnosis present

## 2012-06-11 DIAGNOSIS — J449 Chronic obstructive pulmonary disease, unspecified: Secondary | ICD-10-CM | POA: Diagnosis present

## 2012-06-11 DIAGNOSIS — R4182 Altered mental status, unspecified: Secondary | ICD-10-CM | POA: Diagnosis present

## 2012-06-11 DIAGNOSIS — M179 Osteoarthritis of knee, unspecified: Secondary | ICD-10-CM | POA: Diagnosis present

## 2012-06-11 DIAGNOSIS — I609 Nontraumatic subarachnoid hemorrhage, unspecified: Secondary | ICD-10-CM | POA: Diagnosis present

## 2012-06-11 HISTORY — DX: Chronic obstructive pulmonary disease, unspecified: J44.9

## 2012-06-11 LAB — CBC WITH DIFFERENTIAL/PLATELET
Eosinophils Absolute: 0 10*3/uL (ref 0.0–0.7)
Hemoglobin: 14.9 g/dL (ref 12.0–15.0)
Lymphs Abs: 1.1 10*3/uL (ref 0.7–4.0)
Monocytes Relative: 3 % (ref 3–12)
Neutro Abs: 8 10*3/uL — ABNORMAL HIGH (ref 1.7–7.7)
Neutrophils Relative %: 85 % — ABNORMAL HIGH (ref 43–77)
Platelets: 150 10*3/uL (ref 150–400)
RBC: 4.79 MIL/uL (ref 3.87–5.11)
WBC: 9.4 10*3/uL (ref 4.0–10.5)

## 2012-06-11 LAB — POCT I-STAT 3, ART BLOOD GAS (G3+)
Acid-Base Excess: 4 mmol/L — ABNORMAL HIGH (ref 0.0–2.0)
Bicarbonate: 32.6 mEq/L — ABNORMAL HIGH (ref 20.0–24.0)
Patient temperature: 37
TCO2: 35 mmol/L (ref 0–100)

## 2012-06-11 LAB — BASIC METABOLIC PANEL
BUN: 26 mg/dL — ABNORMAL HIGH (ref 6–23)
Chloride: 102 mEq/L (ref 96–112)
GFR calc Af Amer: 30 mL/min — ABNORMAL LOW (ref >90)
GFR calc non Af Amer: 26 mL/min — ABNORMAL LOW (ref >90)
Glucose, Bld: 131 mg/dL — ABNORMAL HIGH (ref 70–99)
Potassium: 3.9 mEq/L (ref 3.5–5.1)
Sodium: 141 mEq/L (ref 135–145)

## 2012-06-11 LAB — HEPATIC FUNCTION PANEL
ALT: 31 U/L (ref 0–35)
AST: 22 U/L (ref 0–37)
Albumin: 3.6 g/dL (ref 3.5–5.2)
Bilirubin, Direct: <0.1 mg/dL (ref 0.0–0.3)
Total Bilirubin: 0.4 mg/dL (ref 0.3–1.2)

## 2012-06-11 LAB — RAPID URINE DRUG SCREEN, HOSP PERFORMED
Amphetamines: NOT DETECTED
Barbiturates: NOT DETECTED
Cocaine: NOT DETECTED
Opiates: POSITIVE — AB
Tetrahydrocannabinol: NOT DETECTED

## 2012-06-11 LAB — URINALYSIS, ROUTINE W REFLEX MICROSCOPIC
Glucose, UA: NEGATIVE mg/dL
Hgb urine dipstick: NEGATIVE
Ketones, ur: NEGATIVE mg/dL
Protein, ur: 30 mg/dL — AB
Urobilinogen, UA: 0.2 mg/dL (ref 0.0–1.0)

## 2012-06-11 LAB — URINE MICROSCOPIC-ADD ON

## 2012-06-11 MED ORDER — GUAIFENESIN-DM 100-10 MG/5ML PO SYRP: 5.0000 mL | ORAL_SOLUTION | ORAL | Status: DC | PRN

## 2012-06-11 MED ORDER — HYDROCODONE-ACETAMINOPHEN 5-325 MG PO TABS: 1.0000 | ORAL_TABLET | ORAL | Status: DC | PRN

## 2012-06-11 MED ORDER — NALOXONE HCL 0.4 MG/ML IJ SOLN
0.4000 mg | Freq: Once | INTRAMUSCULAR | Status: AC
  Administered 2012-06-11: 0.4 mg via INTRAVENOUS
  Filled 2012-06-11: qty 1

## 2012-06-11 MED ORDER — ONDANSETRON HCL 4 MG/2ML IJ SOLN
4.0000 mg | Freq: Four times a day (QID) | INTRAMUSCULAR | Status: DC | PRN
Start: 2012-06-11 — End: 2012-06-12

## 2012-06-11 MED ORDER — LEVOTHYROXINE SODIUM 50 MCG PO TABS
50.0000 ug | ORAL_TABLET | Freq: Every day | ORAL | Status: DC
  Administered 2012-06-12: 50 ug via ORAL
  Filled 2012-06-11 (×2): qty 1

## 2012-06-11 MED ORDER — NALOXONE HCL 0.4 MG/ML IJ SOLN
INTRAMUSCULAR | Status: AC
  Filled 2012-06-11: qty 1

## 2012-06-11 MED ORDER — NALOXONE HCL 0.4 MG/ML IJ SOLN
0.4000 mg | INTRAMUSCULAR | Status: DC | PRN
  Administered 2012-06-11: 0.4 mg via INTRAVENOUS

## 2012-06-11 MED ORDER — SODIUM CHLORIDE 0.9 % IJ SOLN: 3.0000 mL | Freq: Two times a day (BID) | INTRAMUSCULAR | Status: DC

## 2012-06-11 MED ORDER — SODIUM CHLORIDE 0.9 % IV SOLN: INTRAVENOUS | Status: DC

## 2012-06-11 MED ORDER — ALBUTEROL SULFATE (5 MG/ML) 0.5% IN NEBU: 2.5000 mg | INHALATION_SOLUTION | RESPIRATORY_TRACT | Status: DC | PRN

## 2012-06-11 MED ORDER — SODIUM CHLORIDE 0.9 % IV SOLN
INTRAVENOUS | Status: DC
  Administered 2012-06-11: 120 mL/h via INTRAVENOUS

## 2012-06-11 MED ORDER — SODIUM CHLORIDE 0.9 % IV BOLUS (SEPSIS)
1000.0000 mL | Freq: Once | INTRAVENOUS | Status: AC
  Administered 2012-06-11: 1000 mL via INTRAVENOUS

## 2012-06-11 MED ORDER — ONDANSETRON HCL 4 MG PO TABS: 4.0000 mg | ORAL_TABLET | Freq: Four times a day (QID) | ORAL | Status: DC | PRN

## 2012-06-11 NOTE — ED Notes (Addendum)
Pt reports confusion x 3 weeks. Pt AAOx4. Family reports pt had chest congestion, low oxygen level, increased confusion for past 3-4 hours. Pt last seen normal last night. Pulse ox 85% in triage. Pt placed on O2 2L pulse ox increased to 91%.

## 2012-06-11 NOTE — ED Notes (Addendum)
Narcan given pt alert. Family at bedside

## 2012-06-11 NOTE — H&P (Addendum)
[facility]                                                                                    Patient Demographics  Jessica Holmes, is a 56 y.o. female  CSN: [id number]  MRN: [id number]  DOB - 1955-09-15  Admit Date - 06/11/2012  Outpatient Primary MD for the patient is [name], [name], MD   With History of -  Past Medical History  Diagnosis Date  . Anxiety   . Chronic pain   . Thyroid disorder   . Osteopenia   . Acid reflux   . COPD (chronic obstructive pulmonary disease)       Past Surgical History  Procedure Date  . Spinal fusion   . Vaginal hysterectomy   . Wrist surgery   . Laminectomy     in for   Chief Complaint  Patient presents with  . Altered Mental Status     HPI  Jessica Holmes  is a 56 y.o. female, with history of GERD, COPD, chronic back pain since 2010, narcotic abuser, who is trying to taper her narcotics down and according to the husband all her narcotics and lock and key and only given to her by her husband on scheduled time as prescribed by her physician, comes to the hospital after husband found her to be extremity lethargic and sleepy, she arrived in the ER where she was found to have acute renal failure, according to the husband patient had been eating and drinking less for the last few days, her chest x-ray was unremarkable lab work was essentially under MAC about except for acute renal insufficiency, she received a dose of Narcan it ER with good results, she immediately woke up now she is only mildly somnolent, she denies any intentional overdose of medications i.e. her narcotics, it was thought that due to her decreased by mouth intake she developed acute renal insufficiency and hence her pain medications a cumulative system causing decreased mental status.  She currently has no subjective complaints, she has good cough reflex, mentating well, only slightly somnolent.    Review of Systems    In addition to the HPI above, No  Fever-chills, No Headache, No changes with Vision or hearing, No problems swallowing food or Liquids, No Chest pain, Cough or Shortness of Breath, No Abdominal pain, No Nausea or Vommitting, Bowel movements are regular, No Blood in stool or Urine, No dysuria, No new skin rashes or bruises, No new joints pains-aches, chronic low back pain No new weakness, tingling, numbness in any extremity, No recent weight gain or loss, No polyuria, polydypsia or polyphagia, No significant Mental Stressors.  A full 10 point Review of Systems was done, except as stated above, all other Review of Systems were negative.   Social History History  Substance Use Topics  . Smoking status: Never Smoker   . Smokeless tobacco: Not on file  . Alcohol Use: No     Family History Family History  Problem Relation Age of Onset  . Arthritis    . Cancer       Prior to Admission medications   Medication Sig Start Date End Date Taking?  Authorizing Provider  alendronate (FOSAMAX) 70 MG tablet Take 70 mg by mouth every 7 (seven) days. Take with a full glass of water on an empty stomach.Takes on Sunday   Yes Historical Provider, MD  BIOTIN PO Take 3,000 mcg by mouth daily.    Yes Historical Provider, MD  Coenzyme Q10 (COQ10 PO) Take 100 mg by mouth daily.    Yes Historical Provider, MD  Eszopiclone (ESZOPICLONE) 3 MG TABS Take 3 mg by mouth at bedtime. Take immediately before bedtime   Yes Historical Provider, MD  L-Lysine 500 MG CAPS Take 1 capsule by mouth 2 (two) times daily.    Yes Historical Provider, MD  levothyroxine (SYNTHROID, LEVOTHROID) 50 MCG tablet Take 50 mcg by mouth daily.   Yes Historical Provider, MD  LORazepam (ATIVAN) 2 MG tablet Take 2 mg by mouth 3 (three) times daily.    Yes Historical Provider, MD  morphine (MS CONTIN) 100 MG 12 hr tablet Take 100 mg by mouth daily.    Yes Historical Provider, MD  morphine (MSIR) 30 MG tablet Take 30 mg by mouth 2 (two) times daily.    Yes Historical  Provider, MD  Multiple Vitamin (MULTIVITAMIN PO) Take 1 tablet by mouth daily.    Yes Historical Provider, MD  omega-3 acid ethyl esters (LOVAZA) 1 G capsule Take 1.2 g by mouth 2 (two) times daily.   Yes Historical Provider, MD  pantoprazole (PROTONIX) 40 MG tablet Take 40 mg by mouth daily.    Yes Historical Provider, MD  Pitavastatin Calcium (LIVALO) 4 MG TABS Take 1 tablet by mouth daily.   Yes Historical Provider, MD  pregabalin (LYRICA) 150 MG capsule Take 150 mg by mouth 2 (two) times daily. For pain   Yes Historical Provider, MD  traZODone (DESYREL) 100 MG tablet Take 200 mg by mouth at bedtime.    Yes Historical Provider, MD    No Known Allergies  Physical Exam  Vitals  Blood pressure 128/59, pulse 98, temperature 98.1 F (36.7 C), temperature source Oral, resp. rate 12, SpO2 98.00%.   1. General middle-aged Caucasian female lying in bed in NAD,  somewhat sleepy but answering all questions at this time.  2. Normal affect and insight, Not Suicidal or Homicidal, Awake Alert, Oriented X 2.  3. No F.N deficits, ALL C.Nerves Intact, Strength 5/5 all 4 extremities, Sensation intact all 4 extremities, Plantars down going.  4. Ears and Eyes appear Normal, Conjunctivae clear, PERRLA. Moist Oral Mucosa.  5. Supple Neck, No JVD, No cervical lymphadenopathy appriciated, No Carotid Bruits.  6. Symmetrical Chest wall movement, Good air movement bilaterally, CTAB.  7. RRR, No Gallops, Rubs or Murmurs, No Parasternal Heave.  8. Positive Bowel Sounds, Abdomen Soft, Non tender, No organomegaly appriciated,No rebound -guarding or rigidity.  9.  No Cyanosis, Normal Skin Turgor, No Skin Rash or Bruise.  10. Good muscle tone,  joints appear normal , no effusions, Normal ROM.  11. No Palpable Lymph Nodes in Neck or Axillae     Data Review  CBC  Lab 06/11/12 1520  WBC 9.4  HGB 14.9  HCT 45.6  PLT 150  MCV 95.2  MCH 31.1  MCHC 32.7  RDW 14.0  LYMPHSABS 1.1  MONOABS 0.3    EOSABS 0.0  BASOSABS 0.0  BANDABS --   ------------------------------------------------------------------------------------------------------------------  Chemistries   Lab 06/11/12 1552 06/11/12 1520  NA -- 141  K -- 3.9  CL -- 102  CO2 -- 29  GLUCOSE -- 131*  BUN --  26*  CREATININE -- 2.07*  CALCIUM -- 8.9  MG -- --  AST 22 --  ALT 31 --  ALKPHOS 58 --  BILITOT 0.4 --   ------------------------------------------------------------------------------------------------------------------ CrCl is unknown because there is no height on file for the current visit. ------------------------------------------------------------------------------------------------------------------ No results found for this basename: TSH,T4TOTAL,FREET3,T3FREE,THYROIDAB in the last 72 hours      Imaging results:   Dg Chest Portable 1 View  06/11/2012  *RADIOLOGY REPORT*  Clinical Data: Cough, headache  PORTABLE CHEST - 1 VIEW  Comparison: 10/31/2009 and 12/22/2010  Findings: Cardiomediastinal silhouette is stable.  No acute infiltrate or pulmonary edema.  Stable chronic mild reticular interstitial prominence.  Stable bilateral streaky atelectasis or scarring.  IMPRESSION: No active disease.  No significant change.   Original Report Authenticated By: Natasha Mead, M.D.     My personal review of EKG: Rhythm NSR, Rate  95 /min, nonspecific ST changes    Assessment & Plan   1. Decreased mental status do to accumulation of narcotics secondary to acute renal failure which in turn was caused by decreased by mouth intake - patient and husband confirm that all narcotics under lock and key and her only given to the patient on scheduled time, she does confirm decreased by mouth intake for the last few days. At this time she has good results with Narcan, she will be observed overnight in stepped-down, aggressive IV fluids for hydration and reversal of acute renal failure, we'll keep head of the bed elevated,  aspiration precautions, swallow screen by RN, if passes she will be placed on clear liquid diet which will be advanced if tolerated to heart healthy with feeding assistance and aspiration precautions. For the sake of completeness we will check acetaminophen level and salicylate level along with TSH.     2. Acute renal failure secondary to decreased by mouth intake.- Last creatinine is 0.5 about one year ago in our system, aggressive IV fluids for hydration, repeat BMP in the morning, avoid nephrotoxic agents. We'll check a UA with culture and urine electrolytes.    3. History of hypothyroidism. Check baseline TSH, home dose Synthroid to be continued for now.    4. Chronic pain and anxiety.- Due to #1 above. We'll hold all potentially sedative medications, of note I am holding her benzodiazepines if she is alert awake low-dose benzodiazepines and narcotics can be resumed.Marland Kitchen     DVT Prophylaxis  SCDs   AM Labs Ordered, also please review Full Orders  Family Communication: Admission, patients condition and plan of care including tests being ordered have been discussed with the patient and husband who indicate understanding and agree with the plan and Code Status.  Code Status full  Disposition Plan: Home  Time spent in minutes : 40  Condition fair  Leroy Sea M.D on 06/11/2012 at 5:48 PM  Between 7am to 7pm - Pager - [phone]  After 7pm go to [email or web address] - password [email or web address]  And look for the night coverage person covering me after hours  [facility] Office  [phone]

## 2012-06-11 NOTE — ED Provider Notes (Signed)
History     CSN: [id number]  Arrival date & time 06/11/12  1455   First MD Initiated Contact with Patient 06/11/12 1529      Chief Complaint  Patient presents with  . Altered Mental Status    (Consider location/radiation/quality/duration/timing/severity/associated sxs/prior treatment) Patient is a 56 y.o. female presenting with altered mental status. The history is provided by the spouse.  Altered Mental Status This is a new problem. The current episode started today. The problem occurs constantly. The problem has been unchanged. Associated symptoms include anorexia, coughing (has been going on for several months, husband unsure of cause, patient has been on several antibiotics for this), headaches and weakness. Pertinent negatives include no abdominal pain, change in bowel habit, chest pain, congestion, fatigue, fever, nausea, neck pain, rash or vomiting. Nothing aggravates the symptoms. She has tried nothing for the symptoms. The treatment provided no relief.    Past Medical History  Diagnosis Date  . Anxiety   . Chronic pain   . Thyroid disorder   . Osteopenia   . Acid reflux   . COPD (chronic obstructive pulmonary disease)     Past Surgical History  Procedure Date  . Spinal fusion   . Vaginal hysterectomy   . Wrist surgery   . Laminectomy     Family History  Problem Relation Age of Onset  . Arthritis    . Cancer      History  Substance Use Topics  . Smoking status: Never Smoker   . Smokeless tobacco: Not on file  . Alcohol Use: No    OB History    Grav Para Term Preterm Abortions TAB SAB Ect Mult Living                  Review of Systems  Constitutional: Negative for fever and fatigue.  HENT: Negative for congestion and neck pain.   Respiratory: Positive for cough (has been going on for several months, husband unsure of cause, patient has been on several antibiotics for this). Negative for shortness of breath.   Cardiovascular: Negative for chest  pain.  Gastrointestinal: Positive for anorexia. Negative for nausea, vomiting, abdominal pain, diarrhea and change in bowel habit.  Skin: Negative for rash.  Neurological: Positive for weakness and headaches.  Psychiatric/Behavioral: Positive for altered mental status.  All other systems reviewed and are negative.    Allergies  Review of patient's allergies indicates no known allergies.  Home Medications   Current Outpatient Rx  Name Route Sig Dispense Refill  . ALENDRONATE SODIUM 70 MG PO TABS Oral Take 70 mg by mouth every 7 (seven) days. Take with a full glass of water on an empty stomach.     Marland Kitchen BIOTIN PO Oral Take by mouth.      Marland Kitchen VITAMIN D 2000 UNITS PO CAPS Oral Take by mouth.      . COQ10 PO Oral Take by mouth.      . ESZOPICLONE 3 MG PO TABS Oral Take 3 mg by mouth at bedtime. Take immediately before bedtime     . L-LYSINE 500 MG PO CAPS Oral Take by mouth.      Marland Kitchen LORAZEPAM 2 MG PO TABS Oral Take 2 mg by mouth every 6 (six) hours as needed.      . MORPHINE SULFATE ER 100 MG PO TB12 Oral Take 100 mg by mouth 2 (two) times daily.      . MORPHINE SULFATE 30 MG PO TABS Oral Take 30 mg by mouth  every 6 (six) hours as needed.      . MULTIVITAMIN PO Oral Take by mouth.      Marland Kitchen PANTOPRAZOLE SODIUM 40 MG PO TBEC Oral Take 40 mg by mouth daily.      Marland Kitchen PITAVASTATIN CALCIUM 2 MG PO TABS Oral Take by mouth.      Marland Kitchen PREGABALIN 150 MG PO CAPS Oral Take 150 mg by mouth 2 (two) times daily.      . TRAZODONE HCL 100 MG PO TABS Oral Take 100 mg by mouth at bedtime.        BP 98/74  Pulse 98  Temp 97.1 F (36.2 C) (Oral)  Resp 18  SpO2 85%  Physical Exam  Nursing note and vitals reviewed. Constitutional: She appears well-developed and well-nourished. No distress.  HENT:  Head: Normocephalic and atraumatic.  Eyes: EOM are normal. Pupils are equal, round, and reactive to light.       Pinpoint pupils  Neck: Normal range of motion.  Cardiovascular: Normal rate and normal heart sounds.     Pulmonary/Chest: Effort normal and breath sounds normal. No respiratory distress.  Abdominal: Soft. She exhibits no distension. There is no tenderness.  Musculoskeletal: Normal range of motion.  Neurological: She is alert. No cranial nerve deficit or sensory deficit. She exhibits normal muscle tone. Coordination normal. GCS eye subscore is 3. GCS verbal subscore is 4. GCS motor subscore is 6.  Skin: Skin is warm and dry.    ED Course  Procedures (including critical care time)  Labs Reviewed  CBC WITH DIFFERENTIAL - Abnormal; Notable for the following:    Neutrophils Relative 85 (*)     Neutro Abs 8.0 (*)     All other components within normal limits  BASIC METABOLIC PANEL  HEPATIC FUNCTION PANEL  AMMONIA   No results found.   No diagnosis found.    MDM  3:52 PM Pt seen and examined. Pt with increased sleep since last night (husband found her still sleeping in bed after church, having never gotten up this morning, around 15 hours later). Pt is former narcotics abuser and is on chronic pain medicine. Will give dose of narcan while waiting on AMS labs to return, including ammonia as patient has history of hep C.   4:18 PM Pt given narcan and is now completely awake. Feel that cause of AMS likely narcotic overdose. Will continue to observe to see if patient needs to be placed on a drip.   5:17 PM Will admit patient to stepdown unit and give second dose of narcan. Will continue fluids as patient appears to be in acute renal failure.   Daleen Bo, MD 06/11/12 [phone]

## 2012-06-11 NOTE — ED Notes (Signed)
cbg reads 122

## 2012-06-12 LAB — CBC
MCH: 31.2 pg (ref 26.0–34.0)
MCV: 92.8 fL (ref 78.0–100.0)
Platelets: 146 10*3/uL — ABNORMAL LOW (ref 150–400)
RBC: 3.88 MIL/uL (ref 3.87–5.11)
RDW: 13.7 % (ref 11.5–15.5)
WBC: 18.9 10*3/uL — ABNORMAL HIGH (ref 4.0–10.5)

## 2012-06-12 LAB — BASIC METABOLIC PANEL
CO2: 26 mEq/L (ref 19–32)
Calcium: 7.9 mg/dL — ABNORMAL LOW (ref 8.4–10.5)
Creatinine, Ser: 0.69 mg/dL (ref 0.50–1.10)
GFR calc non Af Amer: >90 mL/min (ref >90)
Sodium: 136 mEq/L (ref 135–145)

## 2012-06-12 LAB — SODIUM, URINE, RANDOM: Sodium, Ur: 155 mEq/L

## 2012-06-12 LAB — TSH: TSH: 0.811 u[IU]/mL (ref 0.350–4.500)

## 2012-06-12 LAB — CREATININE, URINE, RANDOM: Creatinine, Urine: 65.53 mg/dL

## 2012-06-12 NOTE — ED Provider Notes (Signed)
I  reviewed the resident's note and I agree with the findings and plan.     Nelia Shi, MD 06/12/12 [phone]

## 2012-06-12 NOTE — Plan of Care (Signed)
Problem: Phase I Progression Outcomes Goal: OOB as tolerated unless otherwise ordered Outcome: Progressing Bedrest with Bathroom priviliges

## 2012-06-12 NOTE — Progress Notes (Signed)
Discharge instructions given to patient and husband with readback. Pt and husband states patient does not take biotin COQ10, eszolicione or omega 3 since last admit.  IV dc'd intact, no complaints. Discharged home.

## 2012-06-12 NOTE — Discharge Summary (Signed)
DISCHARGE SUMMARY  Jessica Holmes  MR#: [id number]  DOB:1956-02-13  Date of Admission: 06/11/2012 Date of Discharge: 06/12/2012  Attending Physician:Shantay Sonn T  Patient's WUJ:WJXBJ, Jessica John, MD  Consults:  none  Disposition: D/C Home  Follow-up Appts:     Follow-up Information    Follow up with [name], Jessica John, MD. In 5 days.   Contact information:   [facility] [address] [place] [address] [phone]          Tests Needing Follow-up: none  Discharge Diagnoses: Altered mental status - narcotic induced encephalopathy - resolved Severe DH - due to simple poor intake Acute Renal Failure Recent CAP Hypothyroid Chronic Pain Syndrome - narcotic dependent Anxiety COPD GERD  Initial presentation: 56 y.o. female, with history of GERD, COPD, chronic back pain since 2010, narcotic abuser, who is trying to taper her narcotics down and according to the husband all her narcotics are kept under lock and key and only given to her by her husband on scheduled time as prescribed by her physician, comes to the hospital after husband found her to be extremity lethargic and sleepy, she arrived in the ER where she was found to have acute renal failure, according to the husband patient had been eating and drinking less for the last few days, her chest x-ray was unremarkable lab work was essentially unremarkable except for acute renal insufficiency, she received a dose of Narcan it ER with good results, she immediately woke up.  She denied any intentional overdose of medications i.e. her narcotics, it was thought that due to her decreased by mouth intake she developed acute renal insufficiency and hence her pain medications accumulated in her system causing decreased mental status.   Hospital Course:  Altered mental status - narcotic induced encephalopathy - resolved The patient's altered mental status was felt to be due to the accumulation  of narcotics in the setting of acute renal failure - the patient and her husband vehemently denied abuse/overuse of her narcotic pain medications and I believe him - with correction of her renal failure and with Narcan dosing the patient has returned to her baseline mental status - at the time of discharge she is alert and oriented x4 with no complaints  Severe DH - due to simple poor intake The patient has been battling a community acquired pneumonia in the outpatient setting over the last 2 weeks - during this time she admits to very poor oral intake of fluids or food - this appears to be the source for simple dehydration - this was able to be quickly remedied with IV fluid resuscitation  Acute Renal Failure This was clearly a prerenal azotemia due to the patient's significant dehydration - her creatinine peaked at 2.07 - at the time of her discharge after volume resuscitation her creatinine has normalized at 0.7 with a BUN of 12  Recent CAP There are no symptoms to suggest persistent pulmonary infection  Hypothyroid No changes were made in her Synthroid therapy  Chronic Pain Syndrome - narcotic dependent the patient is advised to her usual pain management regimen and educated on the need to assure that she remains hydrated  Anxiety No adjustments have been made in her treatment regimen  COPD Well compensated at present  GERD No acute symptoms    Medication List     As of 06/12/2012 11:43 AM    TAKE these medications         alendronate 70 MG tablet  Commonly known as: FOSAMAX   Take 70 mg by mouth every 7 (seven) days. Take with a full glass of water on an empty stomach.Takes on Sunday      BIOTIN PO   Take 3,000 mcg by mouth daily.      COQ10 PO   Take 100 mg by mouth daily.      eszopiclone 3 MG Tabs   Generic drug: Eszopiclone   Take 3 mg by mouth at bedtime. Take immediately before bedtime      L-Lysine 500 MG Caps   Take 1 capsule by mouth 2 (two) times daily.       levothyroxine 50 MCG tablet   Commonly known as: SYNTHROID, LEVOTHROID   Take 50 mcg by mouth daily.      LIVALO 4 MG Tabs   Generic drug: Pitavastatin Calcium   Take 1 tablet by mouth daily.      LORazepam 2 MG tablet   Commonly known as: ATIVAN   Take 2 mg by mouth 3 (three) times daily.      morphine 100 MG 12 hr tablet   Commonly known as: MS CONTIN   Take 100 mg by mouth daily.      morphine 30 MG tablet   Commonly known as: MSIR   Take 30 mg by mouth 2 (two) times daily.      MULTIVITAMIN PO   Take 1 tablet by mouth daily.      omega-3 acid ethyl esters 1 G capsule   Commonly known as: LOVAZA   Take 1.2 g by mouth 2 (two) times daily.      pantoprazole 40 MG tablet   Commonly known as: PROTONIX   Take 40 mg by mouth daily.      pregabalin 150 MG capsule   Commonly known as: LYRICA   Take 150 mg by mouth 2 (two) times daily. For pain      traZODone 100 MG tablet   Commonly known as: DESYREL   Take 200 mg by mouth at bedtime.        Day of Discharge BP 111/54  Pulse 74  Temp 98.2 F (36.8 C) (Oral)  Resp 22  Ht 5\' 6"  (1.676 m)  Wt 70.7 kg (155 lb 13.8 oz)  BMI 25.16 kg/m2  SpO2 94%  Physical Exam: General: No acute respiratory distress Lungs: Clear to auscultation bilaterally without wheezes or crackles Cardiovascular: Regular rate and rhythm without murmur gallop or rub - normal S1 and S2 Abdomen: Nontender, nondistended, soft, bowel sounds positive, no rebound, no ascites, no appreciable mass Extremities: No significant cyanosis, clubbing, or edema bilateral lower extremities  Results for orders placed during the hospital encounter of 06/11/12 (from the past 24 hour(s))  CBC WITH DIFFERENTIAL     Status: Abnormal   Collection Time   06/11/12  3:20 PM      Component Value Range   WBC 9.4  4.0 - 10.5 K/uL   RBC 4.79  3.87 - 5.11 MIL/uL   Hemoglobin 14.9  12.0 - 15.0 g/dL   HCT 16.1  09.6 - 04.5 %   MCV 95.2  78.0 - 100.0 fL   MCH 31.1   26.0 - 34.0 pg   MCHC 32.7  30.0 - 36.0 g/dL   RDW 40.9  81.1 - 91.4 %   Platelets 150  150 - 400 K/uL   Neutrophils Relative 85 (*) 43 - 77 %   Neutro Abs 8.0 (*) 1.7 - 7.7 K/uL  Lymphocytes Relative 12  12 - 46 %   Lymphs Abs 1.1  0.7 - 4.0 K/uL   Monocytes Relative 3  3 - 12 %   Monocytes Absolute 0.3  0.1 - 1.0 K/uL   Eosinophils Relative 0  0 - 5 %   Eosinophils Absolute 0.0  0.0 - 0.7 K/uL   Basophils Relative 0  0 - 1 %   Basophils Absolute 0.0  0.0 - 0.1 K/uL  BASIC METABOLIC PANEL     Status: Abnormal   Collection Time   06/11/12  3:20 PM      Component Value Range   Sodium 141  135 - 145 mEq/L   Potassium 3.9  3.5 - 5.1 mEq/L   Chloride 102  96 - 112 mEq/L   CO2 29  19 - 32 mEq/L   Glucose, Bld 131 (*) 70 - 99 mg/dL   BUN 26 (*) 6 - 23 mg/dL   Creatinine, Ser 1.61 (*) 0.50 - 1.10 mg/dL   Calcium 8.9  8.4 - 09.6 mg/dL   GFR calc non Af Amer 26 (*) >90 mL/min   GFR calc Af Amer 30 (*) >90 mL/min  HEPATIC FUNCTION PANEL     Status: Normal   Collection Time   06/11/12  3:52 PM      Component Value Range   Total Protein 7.3  6.0 - 8.3 g/dL   Albumin 3.6  3.5 - 5.2 g/dL   AST 22  0 - 37 U/L   ALT 31  0 - 35 U/L   Alkaline Phosphatase 58  39 - 117 U/L   Total Bilirubin 0.4  0.3 - 1.2 mg/dL   Bilirubin, Direct <0.4  0.0 - 0.3 mg/dL   Indirect Bilirubin NOT CALCULATED  0.3 - 0.9 mg/dL  AMMONIA     Status: Normal   Collection Time   06/11/12  3:52 PM      Component Value Range   Ammonia 31  11 - 60 umol/L  ETHANOL     Status: Normal   Collection Time   06/11/12  3:52 PM      Component Value Range   Alcohol, Ethyl (B) <11  0 - 11 mg/dL  POCT I-STAT 3, BLOOD GAS (G3+)     Status: Abnormal   Collection Time   06/11/12  3:56 PM      Component Value Range   pH, Arterial 7.284 (*) 7.350 - 7.450   pCO2 arterial 68.9 (*) 35.0 - 45.0 mmHg   pO2, Arterial 79.0 (*) 80.0 - 100.0 mmHg   Bicarbonate 32.6 (*) 20.0 - 24.0 mEq/L   TCO2 35  0 - 100 mmol/L   O2 Saturation  93.0     Acid-Base Excess 4.0 (*) 0.0 - 2.0 mmol/L   Patient temperature 37.0 C     Collection site RADIAL, ALLEN'S TEST ACCEPTABLE     Drawn by RT     Sample type ARTERIAL     Comment NOTIFIED PHYSICIAN    URINE RAPID DRUG SCREEN (HOSP PERFORMED)     Status: Abnormal   Collection Time   06/11/12  5:05 PM      Component Value Range   Opiates POSITIVE (*) NONE DETECTED   Cocaine NONE DETECTED  NONE DETECTED   Benzodiazepines POSITIVE (*) NONE DETECTED   Amphetamines NONE DETECTED  NONE DETECTED   Tetrahydrocannabinol NONE DETECTED  NONE DETECTED   Barbiturates NONE DETECTED  NONE DETECTED  URINALYSIS, ROUTINE W REFLEX MICROSCOPIC  Status: Abnormal   Collection Time   06/11/12  5:05 PM      Component Value Range   Color, Urine YELLOW  YELLOW   APPearance CLOUDY (*) CLEAR   Specific Gravity, Urine 1.020  1.005 - 1.030   pH 5.5  5.0 - 8.0   Glucose, UA NEGATIVE  NEGATIVE mg/dL   Hgb urine dipstick NEGATIVE  NEGATIVE   Bilirubin Urine NEGATIVE  NEGATIVE   Ketones, ur NEGATIVE  NEGATIVE mg/dL   Protein, ur 30 (*) NEGATIVE mg/dL   Urobilinogen, UA 0.2  0.0 - 1.0 mg/dL   Nitrite NEGATIVE  NEGATIVE   Leukocytes, UA NEGATIVE  NEGATIVE  URINE MICROSCOPIC-ADD ON     Status: Abnormal   Collection Time   06/11/12  5:05 PM      Component Value Range   Squamous Epithelial / LPF RARE  RARE   Bacteria, UA RARE  RARE   Casts HYALINE CASTS (*) NEGATIVE   Urine-Other MUCOUS PRESENT    ACETAMINOPHEN LEVEL     Status: Normal   Collection Time   06/11/12  5:55 PM      Component Value Range   Acetaminophen (Tylenol), Serum <15.0  10 - 30 ug/mL  SALICYLATE LEVEL     Status: Abnormal   Collection Time   06/11/12  5:55 PM      Component Value Range   Salicylate Lvl <2.0 (*) 2.8 - 20.0 mg/dL  TSH     Status: Normal   Collection Time   06/11/12  8:31 PM      Component Value Range   TSH 0.811  0.350 - 4.500 uIU/mL  MRSA PCR SCREENING     Status: Normal   Collection Time   06/11/12 10:32  PM      Component Value Range   MRSA by PCR NEGATIVE  NEGATIVE  BASIC METABOLIC PANEL     Status: Abnormal   Collection Time   06/12/12  5:00 AM      Component Value Range   Sodium 136  135 - 145 mEq/L   Potassium 3.8  3.5 - 5.1 mEq/L   Chloride 104  96 - 112 mEq/L   CO2 26  19 - 32 mEq/L   Glucose, Bld 124 (*) 70 - 99 mg/dL   BUN 12  6 - 23 mg/dL   Creatinine, Ser 9.60  0.50 - 1.10 mg/dL   Calcium 7.9 (*) 8.4 - 10.5 mg/dL   GFR calc non Af Amer >90  >90 mL/min   GFR calc Af Amer >90  >90 mL/min  CBC     Status: Abnormal   Collection Time   06/12/12  5:00 AM      Component Value Range   WBC 18.9 (*) 4.0 - 10.5 K/uL   RBC 3.88  3.87 - 5.11 MIL/uL   Hemoglobin 12.1  12.0 - 15.0 g/dL   HCT 45.4  09.8 - 11.9 %   MCV 92.8  78.0 - 100.0 fL   MCH 31.2  26.0 - 34.0 pg   MCHC 33.6  30.0 - 36.0 g/dL   RDW 14.7  82.9 - 56.2 %   Platelets 146 (*) 150 - 400 K/uL    Time spent in discharge (includes decision making & examination of pt): 30 minutes  06/12/2012, 11:43 AM   Lonia Blood, MD [facility] Office  [phone] Pager [phone]  On-Call/Text Page:      [email or web address]      password [facility]

## 2012-06-13 LAB — URINE CULTURE: Colony Count: NO GROWTH

## 2012-10-07 ENCOUNTER — Other Ambulatory Visit: Payer: Self-pay

## 2012-12-04 ENCOUNTER — Ambulatory Visit (INDEPENDENT_AMBULATORY_CARE_PROVIDER_SITE_OTHER): Payer: BC Managed Care – PPO | Admitting: Neurology

## 2012-12-04 ENCOUNTER — Ambulatory Visit (INDEPENDENT_AMBULATORY_CARE_PROVIDER_SITE_OTHER): Payer: BC Managed Care – PPO

## 2012-12-04 DIAGNOSIS — R209 Unspecified disturbances of skin sensation: Secondary | ICD-10-CM

## 2012-12-04 DIAGNOSIS — R278 Other lack of coordination: Secondary | ICD-10-CM

## 2012-12-04 DIAGNOSIS — M47817 Spondylosis without myelopathy or radiculopathy, lumbosacral region: Secondary | ICD-10-CM

## 2012-12-04 DIAGNOSIS — Z0289 Encounter for other administrative examinations: Secondary | ICD-10-CM

## 2012-12-04 DIAGNOSIS — R269 Unspecified abnormalities of gait and mobility: Secondary | ICD-10-CM

## 2012-12-04 NOTE — Procedures (Signed)
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 The patient has a history of lumbosacral spine surgery, and she is on morphine and Lyrica for her discomfort. The patient is being evaluated for the gait instability.    NERVE CONDUCTION STUDIES:  Nerve conduction studies were performed on both upper extremities. The distal motor latencies and motor amplitudes for the median and ulnar nerves were within normal limits. The F wave latencies and nerve conduction velocities for these nerves were also normal. The sensory latencies for the median and ulnar nerves were normal.  Nerve conduction studies were performed on both lower extremities. The distal motor latencies for the peroneal and posterior tibial nerves were normal bilaterally, with a low motor amplitudes for the peroneal nerves bilaterally, normal for the posterior tibial nerves bilaterally. The nerve conduction velocities for the peroneal and posterior tibial nerves were normal bilaterally. The sensory latencies for the sural nerves were normal bilaterally, absent for the peroneal nerves bilaterally. The H reflex latencies were absent bilaterally.  EMG STUDIES:  EMG study was performed on the right upper extremity:  The first dorsal interosseous muscle reveals 2 to 4 K units with full recruitment. No fibrillations or positive waves were noted. The abductor pollicis brevis muscle reveals 2 to 4 K units with full recruitment. No fibrillations or positive waves were noted. The extensor indicis proprius muscle reveals 1 to 3 K units with full recruitment. No fibrillations or positive waves were noted. The pronator teres muscle reveals 2 to 3 K units with full recruitment. No fibrillations or positive waves were noted. The biceps muscle reveals 1 to 2 K units with full recruitment. No fibrillations or positive waves were noted. The triceps muscle reveals 2 to 4 K  units with full recruitment. No fibrillations or positive waves were noted. The anterior deltoid muscle reveals 2 to 3 K units with full recruitment. No fibrillations or positive waves were noted. The cervical paraspinal muscles were tested at 2 levels. No abnormalities of insertional activity were seen at either level tested. There was good relaxation.    EMG study was performed on the right lower extremity:  The tibialis anterior muscle reveals 2 to 3K motor units with full recruitment. No fibrillations or positive waves were seen. The peroneus tertius muscle reveals 2 to 3K motor units with full recruitment. No fibrillations or positive waves were seen. The medial gastrocnemius muscle reveals 1 to 3K motor units with full recruitment. No fibrillations or positive waves were seen. The vastus lateralis muscle reveals 2 to 4K motor units with full recruitment. No fibrillations or positive waves were seen. The iliopsoas muscle reveals 2 to 4K motor units with full recruitment. No fibrillations or positive waves were seen. The biceps femoris muscle (long head) reveals 2 to 4K motor units with full recruitment. No fibrillations or positive waves were seen. The lumbosacral paraspinal muscles were tested at 3 levels, and revealed no abnormalities of insertional activity at all 3 levels tested. There was good relaxation.    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 There is no clear evidence of an overlying peripheral neuropathy. EMG evaluations of the right upper and right lower extremities were within normal limits, without evidence of a myopathic or a neuropathic process. There is no evidence of an anterior horn cell disease process.  Addendum: The  patient was noted to have asterixis on clinical examination today. This may be the etiology of her falls, likely associated  with the use of Lyrica. Clinical correlation is required.  Marlan Palau MD 12/04/2012 4:15 PM  [facility] [address] [place], [place] [address]  Phone [phone] Fax [phone]

## 2012-12-25 ENCOUNTER — Ambulatory Visit (INDEPENDENT_AMBULATORY_CARE_PROVIDER_SITE_OTHER): Payer: BC Managed Care – PPO | Admitting: Neurology

## 2012-12-25 ENCOUNTER — Encounter: Payer: Self-pay | Admitting: Neurology

## 2012-12-25 VITALS — BP 89/54 | HR 77 | Temp 97.6°F | Ht 68.0 in | Wt 142.0 lb

## 2012-12-25 DIAGNOSIS — R251 Tremor, unspecified: Secondary | ICD-10-CM

## 2012-12-25 DIAGNOSIS — R259 Unspecified abnormal involuntary movements: Secondary | ICD-10-CM

## 2012-12-25 NOTE — Patient Instructions (Addendum)
Tremor has resolved.  Return to office only if needed.

## 2012-12-25 NOTE — Progress Notes (Signed)
[facility]  PATIENT: Jessica Holmes DOB: Apr 29, 1956   HISTORY FROM: patient REASON FOR VISIT: follow up for MRI cervical spine results   HISTORICAL  CHIEF COMPLAINT:  Chief Complaint  Patient presents with  . Results    of MRI neck    HISTORY OF PRESENT ILLNESS: Jessica Holmes is a 57 year old Caucasian female who had bilateral hand tremor which was action related, noticed mainly when she was eating.  She had been on Lyrica for the past 5 years and was stopped by Dr. Anne Hahn at last visit when nerve conduction studies were done.  The patient has a history of lumbosacral spine surgery, and she was on morphine and Lyrica for her discomfort.  Since stopping Lyrica, the tremors have resolved.  The patient has no new complaints today.  Balance continues to be a mild issue as pt. Continues to have falls.  Fall recently resulted in a sprained right ankle.  She has no history of major head injury, loss of consciousness,, strokes or seizures.  She does have a hx of hypothyroidism but does not take levothyroxine and states her last TSH was normal.  She has no family history of essential tumor.  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 She denies significant anxiety or stress.   REVIEW OF SYSTEMS: Full 14 system review of systems performed and negative.  ALLERGIES: No Known Allergies  HOME MEDICATIONS: Outpatient Prescriptions Prior to Visit  Medication Sig Dispense Refill  . L-Lysine 500 MG CAPS Take 1 capsule by mouth 2 (two) times daily.       Marland Kitchen levothyroxine (SYNTHROID, LEVOTHROID) 50 MCG tablet Take 50 mcg by mouth daily.      Marland Kitchen LORazepam (ATIVAN) 2 MG tablet Take 2 mg by mouth 3 (three) times daily.       Marland Kitchen morphine (MS CONTIN) 100 MG 12 hr tablet Take 100 mg by mouth daily.       Marland Kitchen morphine (MSIR) 30 MG tablet Take 30 mg by mouth 2 (two) times daily.       . Multiple Vitamin (MULTIVITAMIN PO) Take 1 tablet by mouth daily.        . pantoprazole (PROTONIX) 40 MG tablet Take 40 mg by mouth daily.       . traZODone (DESYREL) 100 MG tablet Take 200 mg by mouth at bedtime.       Marland Kitchen alendronate (FOSAMAX) 70 MG tablet Take 70 mg by mouth every 7 (seven) days. Take with a full glass of water on an empty stomach.Takes on Sunday      . BIOTIN PO Take 3,000 mcg by mouth daily.       . Coenzyme Q10 (COQ10 PO) Take 100 mg by mouth daily.       . Eszopiclone (ESZOPICLONE) 3 MG TABS Take 3 mg by mouth at bedtime. Take immediately before bedtime      . omega-3 acid ethyl esters (LOVAZA) 1 G capsule Take 1.2 g by mouth 2 (two) times daily.      . Pitavastatin Calcium (LIVALO) 4 MG TABS Take 1 tablet by mouth daily.      . pregabalin (LYRICA) 150 MG capsule Take 150 mg by mouth 2 (two) times daily. For pain       No facility-administered medications prior to visit.    PAST MEDICAL HISTORY: Past Medical History  Diagnosis Date  . Anxiety   . Chronic pain   . Thyroid  disorder   . Osteopenia   . Acid reflux   . COPD (chronic obstructive pulmonary disease)   . Depression   . Hyperlipemia     PAST SURGICAL HISTORY: Past Surgical History  Procedure Laterality Date  . Spinal fusion    . Vaginal hysterectomy    . Wrist surgery    . Laminectomy    . De quervain's release  2003    FAMILY HISTORY: Family History  Problem Relation Age of Onset  . Arthritis    . Cancer    . Breast cancer Mother   . Cancer Father   . Parkinsonism Cousin     SOCIAL HISTORY:  History   Social History  . Marital Status: Married    Spouse Name: N/A    Number of Children: N/A  . Years of Education: 13 yrs   Occupational History  . unemployed    Social History Main Topics  . Smoking status: Former Smoker -- 1.00 packs/day    Quit date: 02/20/2010  . Smokeless tobacco: Never Used  . Alcohol Use: No  . Drug Use: No  . Sexually Active: Not on file   Other Topics Concern  . Not on file   Social History Narrative   Pt lives at  home with her spouse.             PHYSICAL EXAM  Filed Vitals:   12/25/12 1421  BP: 89/54  Pulse: 77  Temp: 97.6 F (36.4 C)  TempSrc: Oral  Height: 5\' 8"  (1.727 m)  Weight: 142 lb (64.411 kg)   Body mass index is 21.6 kg/(m^2).  GENERAL EXAM: Patient is in no distress  CARDIOVASCULAR: Regular rate and rhythm, no murmurs, no carotid bruits  NEUROLOGIC: MENTAL STATUS: awake, alert, language fluent, comprehension intact, naming intact CRANIAL NERVE: pupils equal and reactive to light, visual fields full to confrontation, extraocular muscles intact, no nystagmus, facial sensation and strength symmetric, uvula midline, shoulder shrug symmetric, tongue midline. MOTOR: normal bulk and tone, full strength in the BUE, 4/5 strength in RLE, 5/5 strength in LLE. SENSORY: normal and symmetric to light touch, pinprick, temperature, vibration. COORDINATION: finger-nose-finger, fine finger movements normal REFLEXES: deep tendon reflexes present and symmetric GAIT/STATION: narrow based gait; able to walk on toes, heels and tandem; romberg is negative   DIAGNOSTIC DATA (LABS, IMAGING, TESTING) - I reviewed patient records, labs, notes, testing and imaging myself where available.  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 There is no clear evidence of an overlying peripheral neuropathy. EMG evaluations of the right upper and right lower extremities were within normal limits, without evidence of a myopathic or a neuropathic process. There is no evidence of an anterior horn cell disease process.   ASSESSMENT  AND PLAN  57 y.o. year old right-handed Caucasian female for a follow up visit for tremors and to discuss results of recent MRI cervical spine.  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  Discussed MRI results with patient and spouse. Patient describes no radicular pain or peripheral weakness in  the upper extremities and conservative treatment will be advised.  Although action tremor was thought to be possibly associated with anxiety, they have resolved since stopping Lyrica. Patient will be discharged to care of primary physician.  Return to office in future if new issue arises; no need for follow up appt.   LYNN LAM NP-C 12/25/2012, 3:12 PM  [facility] [address] [place], [place] [address] [phone]

## 2013-05-29 ENCOUNTER — Other Ambulatory Visit: Payer: Self-pay | Admitting: Obstetrics & Gynecology

## 2013-05-29 DIAGNOSIS — N644 Mastodynia: Secondary | ICD-10-CM

## 2013-05-29 DIAGNOSIS — N6459 Other signs and symptoms in breast: Secondary | ICD-10-CM

## 2013-06-01 ENCOUNTER — Other Ambulatory Visit: Payer: Self-pay | Admitting: Obstetrics & Gynecology

## 2013-06-01 ENCOUNTER — Ambulatory Visit
Admission: RE | Admit: 2013-06-01 | Discharge: 2013-06-01 | Disposition: A | Discharge status: Home / Self Care | Payer: BC Managed Care – PPO | Source: Ambulatory Visit | Attending: Obstetrics & Gynecology | Admitting: Obstetrics & Gynecology

## 2013-06-01 DIAGNOSIS — N6459 Other signs and symptoms in breast: Secondary | ICD-10-CM

## 2013-06-01 DIAGNOSIS — N644 Mastodynia: Secondary | ICD-10-CM

## 2013-06-28 ENCOUNTER — Other Ambulatory Visit: Payer: Self-pay

## 2014-12-13 ENCOUNTER — Other Ambulatory Visit: Payer: Self-pay | Admitting: Obstetrics and Gynecology

## 2014-12-13 DIAGNOSIS — R229 Localized swelling, mass and lump, unspecified: Principal | ICD-10-CM

## 2014-12-13 DIAGNOSIS — IMO0002 Reserved for concepts with insufficient information to code with codable children: Secondary | ICD-10-CM

## 2014-12-19 ENCOUNTER — Ambulatory Visit
Admission: RE | Admit: 2014-12-19 | Discharge: 2014-12-19 | Disposition: A | Discharge status: Home / Self Care | Payer: BLUE CROSS/BLUE SHIELD | Source: Ambulatory Visit | Attending: Obstetrics and Gynecology | Admitting: Obstetrics and Gynecology

## 2014-12-19 DIAGNOSIS — R229 Localized swelling, mass and lump, unspecified: Principal | ICD-10-CM

## 2014-12-19 DIAGNOSIS — IMO0002 Reserved for concepts with insufficient information to code with codable children: Secondary | ICD-10-CM

## 2015-12-20 ENCOUNTER — Emergency Department (HOSPITAL_COMMUNITY): Payer: BLUE CROSS/BLUE SHIELD

## 2015-12-20 ENCOUNTER — Emergency Department (HOSPITAL_COMMUNITY)
Admission: EM | Admit: 2015-12-20 | Discharge: 2015-12-21 | Disposition: A | Discharge status: Home / Self Care | Payer: BLUE CROSS/BLUE SHIELD | Attending: Emergency Medicine | Admitting: Emergency Medicine

## 2015-12-20 ENCOUNTER — Encounter (HOSPITAL_COMMUNITY): Payer: Self-pay | Admitting: *Deleted

## 2015-12-20 DIAGNOSIS — J449 Chronic obstructive pulmonary disease, unspecified: Secondary | ICD-10-CM | POA: Diagnosis not present

## 2015-12-20 DIAGNOSIS — E785 Hyperlipidemia, unspecified: Secondary | ICD-10-CM | POA: Diagnosis not present

## 2015-12-20 DIAGNOSIS — R079 Chest pain, unspecified: Secondary | ICD-10-CM | POA: Diagnosis present

## 2015-12-20 DIAGNOSIS — F419 Anxiety disorder, unspecified: Secondary | ICD-10-CM | POA: Insufficient documentation

## 2015-12-20 DIAGNOSIS — E079 Disorder of thyroid, unspecified: Secondary | ICD-10-CM | POA: Insufficient documentation

## 2015-12-20 DIAGNOSIS — G8929 Other chronic pain: Secondary | ICD-10-CM | POA: Diagnosis not present

## 2015-12-20 DIAGNOSIS — Z8719 Personal history of other diseases of the digestive system: Secondary | ICD-10-CM | POA: Insufficient documentation

## 2015-12-20 DIAGNOSIS — R0789 Other chest pain: Secondary | ICD-10-CM | POA: Diagnosis not present

## 2015-12-20 DIAGNOSIS — Z79899 Other long term (current) drug therapy: Secondary | ICD-10-CM | POA: Insufficient documentation

## 2015-12-20 DIAGNOSIS — F329 Major depressive disorder, single episode, unspecified: Secondary | ICD-10-CM | POA: Insufficient documentation

## 2015-12-20 DIAGNOSIS — Z87891 Personal history of nicotine dependence: Secondary | ICD-10-CM | POA: Insufficient documentation

## 2015-12-20 LAB — BASIC METABOLIC PANEL
Anion gap: 10 (ref 5–15)
BUN: 14 mg/dL (ref 6–20)
CO2: 25 mmol/L (ref 22–32)
CREATININE: 0.8 mg/dL (ref 0.44–1.00)
Calcium: 9 mg/dL (ref 8.9–10.3)
Chloride: 103 mmol/L (ref 101–111)
GFR calc Af Amer: >60 mL/min (ref >60)
Glucose, Bld: 96 mg/dL (ref 65–99)
POTASSIUM: 3.6 mmol/L (ref 3.5–5.1)
SODIUM: 138 mmol/L (ref 135–145)

## 2015-12-20 LAB — CBC
HCT: 41.5 % (ref 36.0–46.0)
Hemoglobin: 13.6 g/dL (ref 12.0–15.0)
MCH: 31.6 pg (ref 26.0–34.0)
MCHC: 32.8 g/dL (ref 30.0–36.0)
MCV: 96.3 fL (ref 78.0–100.0)
Platelets: 151 10*3/uL (ref 150–400)
RBC: 4.31 MIL/uL (ref 3.87–5.11)
RDW: 12.4 % (ref 11.5–15.5)
WBC: 5.5 10*3/uL (ref 4.0–10.5)

## 2015-12-20 LAB — TROPONIN I: Troponin I: <0.03 ng/mL (ref <0.031)

## 2015-12-20 NOTE — ED Notes (Signed)
No pain at present heaviness in chest only

## 2015-12-20 NOTE — ED Provider Notes (Signed)
CSN: [id number]     Arrival date & time 12/20/15  2040 History   First MD Initiated Contact with Patient 12/20/15 2349     Chief Complaint  Patient presents with  . Chest Pain     (Consider location/radiation/quality/duration/timing/severity/associated sxs/prior Treatment) HPI   Patient is a 60 year old female with past medical history of anxiety, chronic back pain, hypothyroidism, acid reflux, COPD, hyperlipidemia and depression who presents the ED with complaint of chest pain. Patient reports around 6 PM she began having midsternal and right lower anterior chest pain which she describes as a "heavy piercing pain". She notes the pain occurred while she was showering and lasted approximately 2 hours. She notes she had similar pain a few weeks ago while she was watching TV that lasted for approximately 5 minutes, however she notes the pain during that episode was significantly worse. Patient denies any aggravating or alleviating factors. She notes the pain will intermittently radiate to her back. During exam patient reported having a pain to her chest wall under her right breast that lasted for approximately 5 seconds. Patient then reported that all of her chest pain had resolved. Denies fever, chills, headache, lightheadedness, dizziness, cough, shortness of breath, wheezing, palpitations, abdominal pain, nausea, vomiting, diarrhea, numbness, tingling, weakness, diaphoresis. Patient denies any personal or family cardiac history. Patient notes she quit smoking in 2011.   Past Medical History  Diagnosis Date  . Anxiety   . Chronic pain   . Thyroid disorder   . Osteopenia   . Acid reflux   . COPD (chronic obstructive pulmonary disease) (HCC)   . Depression   . Hyperlipemia    Past Surgical History  Procedure Laterality Date  . Spinal fusion    . Vaginal hysterectomy    . Wrist surgery    . Laminectomy    . De quervain's release  2003   Family History  Problem Relation Age of Onset  .  Arthritis    . Cancer    . Breast cancer Mother   . Cancer Father   . Parkinsonism Cousin    Social History  Substance Use Topics  . Smoking status: Former Smoker -- 1.00 packs/day    Quit date: 02/20/2010  . Smokeless tobacco: Never Used  . Alcohol Use: No   OB History    No data available     Review of Systems  Cardiovascular: Positive for chest pain.  All other systems reviewed and are negative.     Allergies  Review of patient's allergies indicates no known allergies.  Home Medications   Prior to Admission medications   Medication Sig Start Date End Date Taking? Authorizing Provider  atorvastatin (LIPITOR) 40 MG tablet Take 40 mg by mouth at bedtime.   Yes Historical Provider, MD  Estradiol (ESTROGEL) 0.75 MG/1.25 GM (0.06%) topical gel Place 1.25 g onto the skin daily.   Yes Historical Provider, MD  Eszopiclone (ESZOPICLONE) 3 MG TABS Take 3 mg by mouth at bedtime. Take immediately before bedtime   Yes Historical Provider, MD  levothyroxine (SYNTHROID, LEVOTHROID) 50 MCG tablet Take 25 mcg by mouth daily.    Yes Historical Provider, MD  morphine (MSIR) 30 MG tablet Take 30 mg by mouth 2 (two) times daily.    Yes Historical Provider, MD  traZODone (DESYREL) 100 MG tablet Take 100 mg by mouth at bedtime.    Yes Historical Provider, MD   BP 120/74 mmHg  Pulse 71  Temp(Src) 98 F (36.7 C) (Oral)  Resp 13  Wt 67.728 kg  SpO2 97% Physical Exam  Constitutional: She is oriented to person, place, and time. She appears well-developed and well-nourished. No distress.  HENT:  Head: Normocephalic and atraumatic.  Mouth/Throat: Oropharynx is clear and moist. No oropharyngeal exudate.  Eyes: Conjunctivae and EOM are normal. Pupils are equal, round, and reactive to light. Right eye exhibits no discharge. Left eye exhibits no discharge. No scleral icterus.  Neck: Normal range of motion. Neck supple.  Cardiovascular: Normal rate, regular rhythm, normal heart sounds and intact  distal pulses.   Pulmonary/Chest: Effort normal and breath sounds normal. No respiratory distress. She has no wheezes. She has no rales. She exhibits tenderness (mild tenderness over right anterior chest wall).  Abdominal: Soft. Bowel sounds are normal. She exhibits no distension and no mass. There is no tenderness. There is no rebound and no guarding.  Musculoskeletal: Normal range of motion. She exhibits no edema.  Neurological: She is alert and oriented to person, place, and time.  Skin: Skin is warm and dry. She is not diaphoretic.  Nursing note and vitals reviewed.   ED Course  Procedures (including critical care time) Labs Review Labs Reviewed  BASIC METABOLIC PANEL  CBC  TROPONIN I  TROPONIN I    Imaging Review Dg Chest 2 View  12/20/2015  CLINICAL DATA:  Chest pain. EXAM: CHEST  2 VIEW COMPARISON:  June 11, 2012. FINDINGS: The heart size and mediastinal contours are within normal limits. Both lungs are clear. No pneumothorax or pleural effusion is noted. Old left clavicular fracture is noted. IMPRESSION: No active cardiopulmonary disease. Electronically Signed   By: Lupita RaiderJames  Green Jr, M.D.   On: 12/20/2015 21:21   I have personally reviewed and evaluated these images and lab results as part of my medical decision-making.   EKG Interpretation   Date/Time:  Saturday December 20 2015 20:42:41 EDT Ventricular Rate:  70 PR Interval:  172 QRS Duration: 84 QT Interval:  386 QTC Calculation: 416 R Axis:   70 Text Interpretation:  Normal sinus rhythm with sinus arrhythmia  Nonspecific ST and T wave abnormality Abnormal ECG inferior ST depression  No significant change since last tracing Confirmed by NANAVATI, MD, Janey GentaANKIT  [phone]([id number]) on 12/21/2015 1:30:35 AM      MDM   Final diagnoses:  Chest pain, unspecified chest pain type   Patient presents with intermittent chest pain that occurred while showering this evening. She notes pain has improved since arrival to the ED. Denies any  associated symptoms. Patient reports she had similar chest pain proximally 2 weeks ago which she notes was significantly worse but resolved spontaneously. Denies any prior cardiac history. Denies family history of cardiac disease. Patient reports she quit smoking in 2011. VSS. Exam revealed mild tenderness over right anterior chest wall. EKG revealed sinus rhythm with nonspecific ST and T wave abnormality, no significant changes from prior. Troponin negative. Labs unremarkable. Chest x-ray negative. HEART score 2. Repeat troponin ordered. Discussed results and plan for repeat trop with pt.   Hand-off to Dr. Rhunette CroftNanavati. Repeat trop pending. Plan to d/c pt home with outpatient cardiology follow up if repeat trop negative.    Satira Sarkicole Elizabeth ParowanNadeau, [place] JerseyPA-C 12/21/15 0159  Derwood KaplanAnkit Nanavati, MD 12/21/15 [phone]

## 2015-12-20 NOTE — ED Notes (Signed)
The pt has been  C/o rt sided chest pain for 1-2 hours no sob dizzy or nausea.  She has had this pain intermittently for 2 weeks.  No cardiac history

## 2015-12-21 LAB — TROPONIN I: Troponin I: <0.03 ng/mL (ref <0.031)

## 2015-12-21 NOTE — ED Notes (Signed)
Patient reports that she experienced the same symptoms approx 2 weeks ago.

## 2015-12-21 NOTE — Discharge Instructions (Signed)
Call the cardiology clinic listed above to schedule a follow-up appointment within the next week. Return to the emergency department if symptoms worsen or new onset of fever, lightheadedness, dizziness, difficulty breathing, abdominal pain, numbness, tingling, weakness, syncope.

## 2015-12-30 ENCOUNTER — Encounter: Payer: Self-pay | Admitting: Cardiology

## 2015-12-30 ENCOUNTER — Ambulatory Visit (INDEPENDENT_AMBULATORY_CARE_PROVIDER_SITE_OTHER): Payer: BLUE CROSS/BLUE SHIELD | Admitting: Cardiology

## 2015-12-30 VITALS — BP 104/70 | HR 62 | Ht 67.5 in | Wt 154.2 lb

## 2015-12-30 DIAGNOSIS — E7800 Pure hypercholesterolemia, unspecified: Secondary | ICD-10-CM

## 2015-12-30 DIAGNOSIS — R0789 Other chest pain: Secondary | ICD-10-CM | POA: Diagnosis not present

## 2015-12-30 NOTE — Patient Instructions (Signed)
Continue your current therapy   Eat healthy and exercise regularly  If you have recurrent chest pain let me know

## 2015-12-30 NOTE — Progress Notes (Signed)
Cardiology Office Note   Date:  12/30/2015   ID:  Jessica Holmes D Goller, DOB [birth year], MRN [id number]  PCP:  Eliott NineLEAGUE-SOBON, JENNIFER, MD  Cardiologist:   Fran Neiswonger Sw[place], MD   Chief Complaint  Patient presents with  . New Evaluation    had chest pain episode, also had a sharp left burning pain, no shortness of breath, has edema in ankles, no pain or cramping in legs, no lightheaded or dizziness      History of Present Illness: Jessica Holmes D Patnode is a 60 y.o. female who presents for evaluation of chest pain following recent ED visit. She reports that in mid April she had an episode of mid sternal chest pain. It was sharp and burning. It was different than her reflux symptoms. It lasted 5 minutes and resolved. She was just watching TV. On April 29 she was watching TV and again developed chest pain. It was right parasternal and radiated under her right breast and into her back. No radiation to arms or jaw. No SOB. No aggravating or alleviating maneuvers. It was localized. It lasted off and on for 2 hours so she went to the ED. There lab work including troponin x 2 were normal. Ecg showed no acute change. She has no recurrent chest pain since then. She does have a history of treated hypercholesterolemia. She quit smoking in 2011. No history of HTN, DM, or family history of premature CAD.   Past Medical History  Diagnosis Date  . Anxiety   . Chronic pain   . Thyroid disorder   . Osteopenia   . Acid reflux   . COPD (chronic obstructive pulmonary disease) (HCC)   . Depression   . Hyperlipemia     Past Surgical History  Procedure Laterality Date  . Spinal fusion    . Vaginal hysterectomy    . Wrist surgery    . Laminectomy    . De quervain's release  2003     Current Outpatient Prescriptions  Medication Sig Dispense Refill  . albuterol (PROAIR HFA) 108 (90 Base) MCG/ACT inhaler Inhale into the lungs.    Marland Kitchen. atorvastatin (LIPITOR) 40 MG tablet Take 40 mg by mouth at bedtime.    . Estradiol  (ESTROGEL) 0.75 MG/1.25 GM (0.06%) topical gel Place 1.25 g onto the skin daily.    . Eszopiclone (ESZOPICLONE) 3 MG TABS Take 3 mg by mouth at bedtime. Take immediately before bedtime    . levothyroxine (SYNTHROID, LEVOTHROID) 50 MCG tablet Take 25 mcg by mouth daily.     Marland Kitchen. morphine (MS CONTIN) 30 MG 12 hr tablet TK 1 TO 2 TS PO ONCE A DAY  0  . morphine (MSIR) 30 MG tablet Take 30 mg by mouth 2 (two) times daily.     . traMADol (ULTRAM) 50 MG tablet take 1 tablet by mouth every 6 hours if needed for pain  0  . traZODone (DESYREL) 50 MG tablet Take 1 mg by mouth 2 (two) times daily.  0   No current facility-administered medications for this visit.    Allergies:   Review of patient's allergies indicates no known allergies.    Social History:  The patient  reports that she quit smoking about 5 years ago. She has never used smokeless tobacco. She reports that she does not drink alcohol or use illicit drugs.   Family History:  The patient's family history includes Breast cancer in her mother; Cancer in her father, maternal grandfather, and mother; Heart attack in her  paternal grandfather and paternal grandmother; Heart failure in her maternal grandmother; Parkinsonism in her cousin; Stroke in her brother.    ROS:  Please see the history of present illness.   Otherwise, review of systems are positive for none.   All other systems are reviewed and negative.    PHYSICAL EXAM: VS:  BP 104/70 mmHg  Pulse 62  Ht 5' 7.5" (1.715 m)  Wt 69.967 kg (154 lb 4 oz)  BMI 23.79 kg/m2 , BMI Body mass index is 23.79 kg/(m^2). GEN: Well nourished, well developed WF , in no acute distress HEENT: normal Neck: no JVD, carotid bruits, or masses Cardiac: RRR; no murmurs, rubs, or gallops,no edema  Respiratory:  clear to auscultation bilaterally, normal work of breathing GI: soft, nontender, nondistended, + BS MS: no deformity or atrophy Skin: warm and dry, no rash Neuro:  Strength and sensation are  intact Psych: euthymic mood, full affect   EKG:  EKG is ordered today. The ekg done 12/20/15 demonstrates NSR with nonspecific ST abnormality. Unchanged from 2013. I have personally reviewed and interpreted this study.    Recent Labs: 12/20/2015: BUN 14; Creatinine, Ser 0.80; Hemoglobin 13.6; Platelets 151; Potassium 3.6; Sodium 138    Lipid Panel No results found for: CHOL, TRIG, HDL, CHOLHDL, VLDL, LDLCALC, LDLDIRECT    Wt Readings from Last 3 Encounters:  12/30/15 69.967 kg (154 lb 4 oz)  12/20/15 67.728 kg (149 lb 5 oz)  12/25/12 64.411 kg (142 lb)      Other studies Reviewed: Additional studies/ records that were reviewed today include: ED records. Review of the above records demonstrates: as noted above.   ASSESSMENT AND PLAN:  1.  Atypical chest pain. Symptoms are not consistent with cardiac pain. Based on her history, risk factors, and laboratory data I think she is at low risk from a cardiac standpoint. I have not recommended further evaluation at this time. Encourage healthy lifestyle modification with diet and exercise. If she has recurrent or persistent chest pain we can reevaluate.    Current medicines are reviewed at length with the patient today.  The patient does not have concerns regarding medicines.  The following changes have been made:  no change  Labs/ tests ordered today include:  No orders of the defined types were placed in this encounter.     Disposition:   FU prn  Signed, Illa Enlow [place], MD  12/30/2015 2:58 PM    [facility] [address] [place], [place], [address] Phone [phone], Fax [phone]

## 2016-01-01 ENCOUNTER — Other Ambulatory Visit: Payer: Self-pay | Admitting: Obstetrics and Gynecology

## 2016-01-01 DIAGNOSIS — Z803 Family history of malignant neoplasm of breast: Secondary | ICD-10-CM

## 2016-01-13 ENCOUNTER — Other Ambulatory Visit: Payer: BLUE CROSS/BLUE SHIELD

## 2023-12-22 DEATH — deceased
